# Patient Record
Sex: Female | Born: 1955
Health system: Southern US, Community
[De-identification: ages and names within clinical notes are randomized; demographics above are authoritative.]

## PROBLEM LIST (undated history)

## (undated) DIAGNOSIS — M1990 Unspecified osteoarthritis, unspecified site: Secondary | ICD-10-CM

## (undated) DIAGNOSIS — J189 Pneumonia, unspecified organism: Secondary | ICD-10-CM

## (undated) HISTORY — PX: CERVICAL DISC SURGERY: SHX588

## (undated) HISTORY — PX: KNEE SURGERY: SHX244

## (undated) HISTORY — PX: TONSILLECTOMY: SUR1361

---

## 1999-04-23 ENCOUNTER — Encounter: Payer: Self-pay | Admitting: Specialist

## 1999-04-23 ENCOUNTER — Encounter (INDEPENDENT_AMBULATORY_CARE_PROVIDER_SITE_OTHER): Payer: Self-pay | Admitting: Specialist

## 1999-04-24 ENCOUNTER — Inpatient Hospital Stay (HOSPITAL_COMMUNITY): Admission: EM | Admit: 1999-04-24 | Discharge: 1999-04-25 | Payer: Self-pay | Admitting: Specialist

## 1999-10-12 ENCOUNTER — Emergency Department (HOSPITAL_COMMUNITY): Admission: EM | Admit: 1999-10-12 | Discharge: 1999-10-12 | Payer: Self-pay | Admitting: *Deleted

## 2000-04-03 ENCOUNTER — Emergency Department (HOSPITAL_COMMUNITY): Admission: EM | Admit: 2000-04-03 | Discharge: 2000-04-03 | Payer: Self-pay | Admitting: Emergency Medicine

## 2002-04-26 ENCOUNTER — Other Ambulatory Visit: Admission: RE | Admit: 2002-04-26 | Discharge: 2002-04-26 | Payer: Self-pay | Admitting: Obstetrics and Gynecology

## 2003-04-28 ENCOUNTER — Inpatient Hospital Stay (HOSPITAL_COMMUNITY): Admission: EM | Admit: 2003-04-28 | Discharge: 2003-05-02 | Payer: Self-pay | Admitting: Emergency Medicine

## 2003-04-29 ENCOUNTER — Encounter: Payer: Self-pay | Admitting: Surgery

## 2003-05-02 ENCOUNTER — Inpatient Hospital Stay (HOSPITAL_COMMUNITY): Admission: EM | Admit: 2003-05-02 | Discharge: 2003-05-05 | Payer: Self-pay | Admitting: Emergency Medicine

## 2003-05-02 ENCOUNTER — Encounter: Payer: Self-pay | Admitting: General Surgery

## 2003-05-03 ENCOUNTER — Encounter: Payer: Self-pay | Admitting: Surgery

## 2003-07-05 ENCOUNTER — Encounter: Payer: Self-pay | Admitting: Gastroenterology

## 2003-07-05 ENCOUNTER — Ambulatory Visit (HOSPITAL_COMMUNITY): Admission: RE | Admit: 2003-07-05 | Discharge: 2003-07-05 | Payer: Self-pay | Admitting: Gastroenterology

## 2005-06-07 ENCOUNTER — Ambulatory Visit (HOSPITAL_COMMUNITY): Admission: RE | Admit: 2005-06-07 | Discharge: 2005-06-07 | Payer: Self-pay | Admitting: Orthopedic Surgery

## 2005-06-14 ENCOUNTER — Emergency Department (HOSPITAL_COMMUNITY): Admission: EM | Admit: 2005-06-14 | Discharge: 2005-06-14 | Payer: Self-pay | Admitting: Emergency Medicine

## 2005-07-22 ENCOUNTER — Ambulatory Visit (HOSPITAL_COMMUNITY): Admission: RE | Admit: 2005-07-22 | Discharge: 2005-07-22 | Payer: Self-pay | Admitting: Orthopedic Surgery

## 2005-08-25 ENCOUNTER — Ambulatory Visit (HOSPITAL_BASED_OUTPATIENT_CLINIC_OR_DEPARTMENT_OTHER): Admission: RE | Admit: 2005-08-25 | Discharge: 2005-08-25 | Payer: Self-pay | Admitting: Orthopedic Surgery

## 2005-08-25 ENCOUNTER — Ambulatory Visit (HOSPITAL_COMMUNITY): Admission: RE | Admit: 2005-08-25 | Discharge: 2005-08-25 | Payer: Self-pay | Admitting: Orthopedic Surgery

## 2005-09-02 ENCOUNTER — Emergency Department (HOSPITAL_COMMUNITY): Admission: EM | Admit: 2005-09-02 | Discharge: 2005-09-02 | Payer: Self-pay | Admitting: Emergency Medicine

## 2005-09-14 ENCOUNTER — Encounter: Admission: RE | Admit: 2005-09-14 | Discharge: 2005-10-27 | Payer: Self-pay | Admitting: Orthopedic Surgery

## 2006-03-10 ENCOUNTER — Other Ambulatory Visit: Admission: RE | Admit: 2006-03-10 | Discharge: 2006-03-10 | Payer: Self-pay | Admitting: Family Medicine

## 2007-02-25 ENCOUNTER — Emergency Department (HOSPITAL_COMMUNITY): Admission: EM | Admit: 2007-02-25 | Discharge: 2007-02-25 | Payer: Self-pay | Admitting: Emergency Medicine

## 2007-04-06 ENCOUNTER — Encounter: Admission: RE | Admit: 2007-04-06 | Discharge: 2007-04-06 | Payer: Self-pay | Admitting: Occupational Medicine

## 2007-05-04 ENCOUNTER — Ambulatory Visit (HOSPITAL_COMMUNITY): Admission: RE | Admit: 2007-05-04 | Discharge: 2007-05-04 | Payer: Self-pay | Admitting: Sports Medicine

## 2007-08-22 ENCOUNTER — Ambulatory Visit (HOSPITAL_BASED_OUTPATIENT_CLINIC_OR_DEPARTMENT_OTHER): Admission: RE | Admit: 2007-08-22 | Discharge: 2007-08-23 | Payer: Self-pay | Admitting: Specialist

## 2007-09-05 ENCOUNTER — Encounter: Admission: RE | Admit: 2007-09-05 | Discharge: 2007-10-11 | Payer: Self-pay | Admitting: Specialist

## 2007-10-12 ENCOUNTER — Encounter: Admission: RE | Admit: 2007-10-12 | Discharge: 2008-01-02 | Payer: Self-pay | Admitting: Specialist

## 2007-10-16 ENCOUNTER — Encounter: Admission: RE | Admit: 2007-10-16 | Discharge: 2008-01-14 | Payer: Self-pay | Admitting: Specialist

## 2008-01-08 ENCOUNTER — Encounter: Admission: RE | Admit: 2008-01-08 | Discharge: 2008-02-08 | Payer: Self-pay | Admitting: Specialist

## 2010-11-01 ENCOUNTER — Encounter: Payer: Self-pay | Admitting: Orthopedic Surgery

## 2011-02-23 NOTE — Op Note (Signed)
Kristin Mckinney, Kristin Mckinney                 ACCOUNT NO.:  [id number]   MEDICAL RECORD NO.:  [id number]          PATIENT TYPE:  AMB   LOCATION:  [facility]                         FACILITY:  [facility]   PHYSICIAN:  Erasmo Leventhal, M.D.DATE OF BIRTH:  May 26, 1956   DATE OF PROCEDURE:  08/22/2007  DATE OF DISCHARGE:  08/23/2007                               OPERATIVE REPORT   PREOPERATIVE DIAGNOSES:  Right knee possible torn meniscus, possible  torn anterior cruciate ligament.   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   PROCEDURE:  Right knee arthroscopic assisted allograft anterior cruciate  ligament reconstruction, chondroplasty patella, chondroplasty medial  femoral condyle.   SURGEON:  Erasmo Leventhal, M.D.   ASSISTANT:  Jaquelyn Bitter. Chabon, P.A.   ANESTHESIA:  Preoperative femoral nerve block with general.   ESTIMATED BLOOD LOSS:  Less than 50 mL.   DRAINS:  None.   COMPLICATIONS:  None.   TOURNIQUET TIME:  One hour 30 minutes at 300 mmHg.   DESCRIPTION OF PROCEDURE:  The patient was counseled in the holding  area.  IV had been started.  Femoral nerve block was administered.  Taken to the operating room, placed in prone position under general  anesthesia.  IV antibiotics were given.  All extremities were well  padded.  Right knee was examined.  She had full extension.  She could  flex to 125.  She had a 1+ Lachman modern end point, 1+ anterior drawer,  moderate end point and a positive flexion rotation drawer.  She was  stable to varus and valgus stress at 0 and 30 degrees of flexion.  PCL  and posterolateral corner were stable.  She was elevated, prepped with  DuraPrep and draped into a sterile fashion, exsanguinated with an  Esmarch, tourniquet was inflated to 300 mmHg.  Arthroscopic portals were  established proximal medial, anteromedial and anterolateral.  Diagnostic  arthroscopy  revealed the following findings.  Patellofemoral joint  revealed normal tracking.  There was grade 3 chondromalacia lateral  patella and facet and the chondroplasty was performed with mechanical  shaver.  Intercondylar notch revealed the ACL to be torn and  nonfunctional.  It  was debrided.  Notchplasty was performed in standard  fashion.  PCL was protected.  Lateral side was inspected.  Mild  chondromalacia lateral side, lateral meniscus intact.  Medial side  inspected.  She had undergone a partial medial meniscectomy in the past,  it was stable.  There was no recurrent tear, but there was a large area  of grade 3 chondromalacia medial femoral condyle with an unstable flap,  mechanical chondroplasty performed back to a stable base.  An age and  size appropriate anterior tibialis allograft had been chosen and was  prepared on the back table by Mr. Leilani Able, PA-C.   Following this, the lateral incision made through the skin and  subcutaneous tissue to the IT band.  A guide pin was placed directly  into the ACL footprint on the lateral intercondylar wall.  Retro rim was  attached and retro tunnel was performed.  Tunnel was debrided on the  tibial side.  The retro reamer was placed directly to the anatomic ACL  footprint, retro tunnel was performed and tunnel was debrided.  Both  tunnels then dilated at the appropriate level.  The graft was now  delivered to the knee.  I securely fixed the femoral graft with an  Arthrex bioabsorbable screw placed in parallel fashion, put the knee  through range of motion.  There was no notch impingement.  The graft had  excellent physiometric movement pattern.  We allowed the graft to creep  for the next several minutes.  At this time, pulling distally on the  tibial side on the tibial graft, we securely fixed in the tibial tunnel  with an Arthrex bioabsorbable screw placed in parallel fashion.  We now  checked the knee.  The graft was well fixed in both  femoral and tibial  sides.  It had excellent physiometric movement pattern and knee was  clinically stable and the graft had excellent tension.  Knee was then  copiously irrigated and arthroscopic clip was removed.  On the lateral  side, IT band was closed with Vicryl, subcu Vicryl, skin with a  subcuticular Monocryl suture, portals nylon anteriorly, subcu Vicryl,  skin and subcuticular Monocryl suture.  After confirming anesthesia,  another  15 mL of 5 cm Marcaine with epinephrine placed in portal sites  and into the knee joint.  Sterile dressings applied at the knee.  Tourniquet was deflated.  Another gram of Ancef given intravenously.  Sterile dressings applied.  She had normal circulation of the foot and  ankle at the end of the case.  There was no complications or problems.  She was then taken from the operating room to PACU in stable condition.   Help with surgical technique, decision making graft preparation, Mr.  Leilani Able, PA-C.  Assistance was needed throughout the entire case.           ______________________________  Erasmo Leventhal, M.D.     RAC/MEDQ  D:  08/22/2007  T:  08/23/2007  Job:  [id number]

## 2011-02-26 NOTE — H&P (Signed)
Kristin Mckinney, Kristin Mckinney                           ACCOUNT NO.:  [id number]   MEDICAL RECORD NO.:  [id number]                   PATIENT TYPE:  INP   LOCATION:  0471                                 FACILITY:  [facility]   PHYSICIAN:  Velora Heckler, M.D.                DATE OF BIRTH:  [birth year]   DATE OF ADMISSION:  04/28/2003  DATE OF DISCHARGE:                                HISTORY & PHYSICAL   REASON FOR ADMISSION:  Abdominal pain, anorexia.   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  The patient had been in her  normal state of good health until the evening of July 17.  She developed  left lower quadrant pain.  She went home and was kept awake most of the  night by discomfort.  Discomfort persisted.  She had no significant  appetite.  She denies nausea or vomiting.  She had a normal bowel movement.  She is urinating normally.  She has had no prior such episodes.  The patient  does note mild temperature elevation.  She notes fatigue.  The patient  presented to work and then contacted me.  At my request she came to the  emergency department for laboratory studies and a specimen.   PAST MEDICAL HISTORY:  1. Status post cesarean section.  2. Status post rotator cuff repair.  3. Status post repair of deviated septum.  4. Status post knee arthroscopy.  5. Status post cervical laminectomy by Kerrin Champagne, M.D.   MEDICATIONS:  Multivitamins.   ALLERGIES:  SULFA causing rash.  Intolerance to MORPHINE, DEMEROL, and  DILAUDID all causing nausea and vomiting.   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  She does not smoke.  She does not drink alcohol.  She has two  children.  She lives in [place].   REVIEW OF SYSTEMS:  15 system review without significant other problems  except as noted  above.   FAMILY HISTORY:  Noncontributory.   PHYSICAL EXAMINATION:  GENERAL:  A 55 year old well-developed, well-  nourished white female.  Mild to moderate discomfort on a stretcher in the  emergency department.  VITAL SIGNS:  Temperature 99.5, pulse 115, respirations 20, blood pressure  122/75.  HEENT:  Normocephalic.  Sclerae are clear.  Conjunctivae are clear.  Pupils  are equal, round, and reactive.  Dentition is good.  Voice quality is  normal.  NECK:  Symmetric.  Thyroid is normal without nodularity.  There is no  anterior/posterior cervical adenopathy.  LUNGS:  Clear to auscultation.  There is no costovertebral angle tenderness.  CARDIAC:  Regular rate and rhythm without murmur.  Peripheral pulses are  full.  ABDOMEN:  Soft with mild distention.  There are bowel  sounds present.  There  is marked tenderness in the left lower quadrant and suprapubic region.  There is voluntary guarding.  There is rebound tenderness and referred  rebound tenderness to the left lower quadrant.  EXTREMITIES:  Nontender without edema.  NEUROLOGIC:  The patient is alert and oriented without focal neurologic  deficit.   LABORATORIES:  Complete blood count shows a white count 12.5.  Differential  shows left shift with segmented neutrophils.  Hemoglobin and platelets are  normal.  Electrolytes are normal.  Creatinine is normal.   Radiographic studies:  CT scan abdomen and pelvis performed by Norva Pavlov, M.D. shows findings consistent with moderate to severe acute  diverticulitis.  There is a small amount of free fluid.  There is no sign of  abscess.  There is no sign of perforation.   IMPRESSION:  Acute diverticulitis.   PLAN:  1. Admission to [facility].  2. n.p.o.  3. Initiation of intravenous antibiotics.  4. Pain control.                                               Velora Heckler, M.D.    TMG/MEDQ  D:  04/29/2003  T:  04/29/2003  Job:  [id number]

## 2011-02-26 NOTE — Discharge Summary (Signed)
[name], [name]                           ACCOUNT NO.:  [id number]   MEDICAL RECORD NO.:  [id number]                   PATIENT TYPE:  INP   LOCATION:  0474                                 FACILITY:  [facility]   PHYSICIAN:  Sandria Bales. Ezzard Standing, M.D.               DATE OF BIRTH:  09/07/1956   DATE OF ADMISSION:  05/02/2003  DATE OF DISCHARGE:  05/05/2003                                 DISCHARGE SUMMARY   DISCHARGE DIAGNOSES:  1. Sigmoid colon diverticulitis with possible small perforation.  2. Oral Candidiasis.   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  She was  discharged on May 02, 2003.  She was discharged home on Cipro and Flagyl.  Her white blood cell count had never been elevated during this episode, but  she developed nausea and vomiting.  She was readmitted by Dr. Thornton Dales  on May 02, 2003.  Her admission temperature was 98.9, pulse was 56, blood  pressure was 135/78.   On her abdominal exam, she had some lower abdominal tenderness, and she had  a repeat CT scan.   HOSPITAL COURSE:  Laboratory data obtained on readmission showed a white  blood cell count of 5800, hemoglobin 11.8, hematocrit 33.  Her sodium was  143, potassium 3.4, chloride 115, CO2 25, glucose 112, total protein 5.5,  albumin 2.8.   The patient was placed on Unasyn on this admission.   Her TCT scan does show an area of inflammatory changes at the proximal  sigmoid colon.  There appears to be a possible focal perforation with a 1-cm  fluid collection, which appears to be outside the lumen of the bowel.  She  also has some fluid around the liver we think is nonspecific and probably  secondary to her __________ on her sigmoid colon.  Because of the way she  was feeling, she certainly had some anxiety in the hospital, she was given  some Ambien to help her sleep, finally started feeling better.  I  switched  her from IV Unasyn to oral Augmentin, which she was tolerating fairly well,  though she developed oral Candidiasis.  She has now been in the hospital for  three days.  She is feeling better.  She is balancing her diet between clear  liquids and full liquids.  She is ready for discharge.   DISCHARGE MEDICATIONS:  1. Augmentin 500 mg one tablet b.i.d. x2 weeks.  2. Because of oral Candidiasis, I gave her Nystatin suspension 5 cc t.i.d.  3. She is given Vicodin for pain.   DIET:  She is to limit her diet.    ACTIVITY:  She is to limit her activity.   FOLLOWUP:  She is to see me back in 7 to 10 days for followup.  Sandria Bales. Ezzard Standing, M.D.    DHN/MEDQ  D:  05/05/2003  T:  05/05/2003  Job:  [id number]   cc:   Velora Heckler, M.D.  [address] [place]  [place] [address]  Fax: [phone]

## 2011-05-22 ENCOUNTER — Inpatient Hospital Stay (INDEPENDENT_AMBULATORY_CARE_PROVIDER_SITE_OTHER)
Admission: RE | Admit: 2011-05-22 | Discharge: 2011-05-22 | Disposition: A | Discharge status: Home / Self Care | Payer: 59 | Source: Ambulatory Visit | Attending: Family Medicine | Admitting: Family Medicine

## 2011-05-22 DIAGNOSIS — N390 Urinary tract infection, site not specified: Secondary | ICD-10-CM

## 2011-05-22 LAB — POCT URINALYSIS DIP (DEVICE)
Ketones, ur: NEGATIVE mg/dL
Protein, ur: >=300 mg/dL — AB
Urobilinogen, UA: 0.2 mg/dL (ref 0.0–1.0)
pH: 5 (ref 5.0–8.0)

## 2013-05-16 ENCOUNTER — Other Ambulatory Visit (HOSPITAL_COMMUNITY): Payer: Self-pay | Admitting: Orthopedic Surgery

## 2013-05-16 DIAGNOSIS — M25562 Pain in left knee: Secondary | ICD-10-CM

## 2013-05-17 ENCOUNTER — Ambulatory Visit (HOSPITAL_COMMUNITY): Admission: RE | Admit: 2013-05-17 | Payer: 59 | Source: Ambulatory Visit

## 2013-05-21 ENCOUNTER — Ambulatory Visit (HOSPITAL_COMMUNITY): Admission: RE | Admit: 2013-05-21 | Payer: 59 | Source: Ambulatory Visit

## 2015-06-27 ENCOUNTER — Emergency Department (HOSPITAL_COMMUNITY)
Admission: EM | Admit: 2015-06-27 | Discharge: 2015-06-27 | Disposition: A | Discharge status: Home / Self Care | Payer: PRIVATE HEALTH INSURANCE | Attending: Emergency Medicine | Admitting: Emergency Medicine

## 2015-06-27 ENCOUNTER — Encounter (HOSPITAL_COMMUNITY): Payer: Self-pay | Admitting: Emergency Medicine

## 2015-06-27 DIAGNOSIS — Y9289 Other specified places as the place of occurrence of the external cause: Secondary | ICD-10-CM | POA: Insufficient documentation

## 2015-06-27 DIAGNOSIS — X58XXXA Exposure to other specified factors, initial encounter: Secondary | ICD-10-CM | POA: Diagnosis not present

## 2015-06-27 DIAGNOSIS — S29012A Strain of muscle and tendon of back wall of thorax, initial encounter: Secondary | ICD-10-CM | POA: Diagnosis not present

## 2015-06-27 DIAGNOSIS — Y9389 Activity, other specified: Secondary | ICD-10-CM | POA: Insufficient documentation

## 2015-06-27 DIAGNOSIS — S299XXA Unspecified injury of thorax, initial encounter: Secondary | ICD-10-CM | POA: Diagnosis present

## 2015-06-27 DIAGNOSIS — Y998 Other external cause status: Secondary | ICD-10-CM | POA: Insufficient documentation

## 2015-06-27 DIAGNOSIS — Z79899 Other long term (current) drug therapy: Secondary | ICD-10-CM | POA: Insufficient documentation

## 2015-06-27 MED ORDER — METHOCARBAMOL 500 MG PO TABS
750.0000 mg | ORAL_TABLET | Freq: Once | ORAL | Status: AC
  Administered 2015-06-27: 750 mg via ORAL
  Filled 2015-06-27: qty 2

## 2015-06-27 MED ORDER — KETOROLAC TROMETHAMINE 30 MG/ML IJ SOLN
30.0000 mg | Freq: Once | INTRAMUSCULAR | Status: AC
  Administered 2015-06-27: 30 mg via INTRAMUSCULAR
  Filled 2015-06-27: qty 1

## 2015-06-27 MED ORDER — NAPROXEN 375 MG PO TABS: 375.0000 mg | ORAL_TABLET | Freq: Three times a day (TID) | ORAL | Status: DC

## 2015-06-27 MED ORDER — METHOCARBAMOL 750 MG PO TABS: 750.0000 mg | ORAL_TABLET | Freq: Three times a day (TID) | ORAL | Status: DC

## 2015-06-27 NOTE — Discharge Instructions (Signed)
The medication as directed.  Follow-up with occupational health, perhaps for physical therapy referral given a work note that he may return on September 20 for a work restriction until the 22nd

## 2015-06-27 NOTE — ED Notes (Signed)
Pt reports 2330 yesterday evening assisting patient in bed and felt strain in left lower back. Pain increasing with mobility. Pain from left side of neck to left mid back, difficulty turning head to left, difficulty raising left arm above shoulder. Denies numbness/tingling. Rates pain 8/10.

## 2015-06-27 NOTE — ED Provider Notes (Signed)
CSN: [id number]     Arrival date & time 06/27/15  0250 History   First MD Initiated Contact with Patient 06/27/15 0324     Chief Complaint  Patient presents with  . 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  She was assisting a patient in bed holding her forward with her left arm when she suddenly felt a strain in her shoulder blade area.  It has become worse over the last 1-2 hours, now it's painful to turn her head to the right lift her arm all the way or move in certain positions.  Patient is a 59 y.o. female presenting with back pain. The history is provided by the patient.  Back Pain Location:  Thoracic spine Quality:  Aching Radiates to:  Does not radiate Pain severity:  Moderate Pain is:  Same all the time Onset quality:  Sudden Timing:  Constant Progression:  Unchanged Chronicity:  New Context: lifting heavy objects   Relieved by:  Nothing Worsened by:  Movement Ineffective treatments:  None tried Associated symptoms: no chest pain, no fever, no numbness and no paresthesias     History reviewed. No pertinent past medical history. Past Surgical History  Procedure Laterality Date  . Knee surgery     No family history on file. Social History  Substance Use Topics  . Smoking status: Never Smoker   . Smokeless tobacco: None  . Alcohol Use: No   OB History    No data available     Review of Systems  Constitutional: Negative for fever.  Respiratory: Negative for cough.   Cardiovascular: Negative for chest pain.  Musculoskeletal: Positive for back pain and arthralgias. Negative for neck pain and neck stiffness.  Skin: Negative for color change and rash.  Neurological: Negative for dizziness, numbness and paresthesias.  All other systems reviewed and are negative.     Allergies  Morphine and related; Percocet; and Sulfa  antibiotics  Home Medications   Prior to Admission medications   Medication Sig Start Date End Date Taking? Authorizing Provider  cetirizine (ZYRTEC) 10 MG tablet Take 10 mg by mouth daily.   Yes Historical Provider, MD  ibuprofen (ADVIL,MOTRIN) 200 MG tablet Take 400-600 mg by mouth every 6 (six) hours as needed for moderate pain.   Yes Historical Provider, MD  methocarbamol (ROBAXIN) 750 MG tablet Take 1 tablet (750 mg total) by mouth 3 (three) times daily. 06/27/15   Earley Favor, NP  Multiple Vitamin (MULTIVITAMIN WITH MINERALS) TABS tablet Take 1 tablet by mouth daily.   Yes Historical Provider, MD  naproxen (NAPROSYN) 375 MG tablet Take 1 tablet (375 mg total) by mouth 3 (three) times daily with meals. 06/27/15   Earley Favor, NP   BP 125/82 mmHg  Pulse 81  Temp(Src) 97.8 F (36.6 C) (Oral)  Resp 18  Ht  (1.676 m)  Wt 165 lb (74.844 kg)  BMI 26.64 kg/m2  SpO2 99% Physical Exam  Constitutional: She is oriented to person, place, and time. She appears well-nourished.  HENT:  Head: Normocephalic.  Eyes: Pupils are equal, round, and reactive to light.  Neck: Normal range of motion. No spinous process tenderness and no muscular tenderness present.    Cardiovascular: Normal rate.   Pulmonary/Chest: Effort normal.  Musculoskeletal: Normal range of motion.       Back:  Neurological: She is alert and oriented to person, place, and time.  Skin:  Skin is warm. No rash noted. No erythema.  Nursing note and vitals reviewed.   ED Course  Procedures (including critical care time) Labs Review Labs Reviewed - No data to display  Imaging Review No results found. I have personally reviewed and evaluated these images and lab results as part of my medical decision-making.   EKG Interpretation None     Physical examination, consistent with a thoracic muscle strain.  She will be started on Robaxin 750 mg 3 times a day as well as Naprosyn 325 mg twice a day follow-up with her primary  care physician, as well as occupational health.  She was taken out of work for several days.  She may return to work on September 20 with restrictions including no heavy lifting, pushing or pulling for 2 more days MDM   Final diagnoses:  Strain of thoracic paraspinal muscles excluding T1 and T2 levels, initial encounter         Earley Favor, NP 06/27/15 2956  Tomasita Crumble, MD 06/27/15 2130

## 2015-10-20 ENCOUNTER — Encounter: Payer: Self-pay | Admitting: Pulmonary Disease

## 2015-10-20 ENCOUNTER — Ambulatory Visit (INDEPENDENT_AMBULATORY_CARE_PROVIDER_SITE_OTHER): Payer: 59 | Admitting: Internal Medicine

## 2015-10-20 ENCOUNTER — Other Ambulatory Visit: Payer: Self-pay | Admitting: Internal Medicine

## 2015-10-20 ENCOUNTER — Other Ambulatory Visit (INDEPENDENT_AMBULATORY_CARE_PROVIDER_SITE_OTHER): Payer: 59

## 2015-10-20 ENCOUNTER — Ambulatory Visit (INDEPENDENT_AMBULATORY_CARE_PROVIDER_SITE_OTHER): Payer: 59 | Admitting: Pulmonary Disease

## 2015-10-20 VITALS — BP 118/80 | HR 75 | Ht 66.0 in | Wt 183.4 lb

## 2015-10-20 VITALS — BP 118/80 | HR 75 | Temp 97.0°F | Ht 66.0 in | Wt 183.4 lb

## 2015-10-20 DIAGNOSIS — R059 Cough, unspecified: Secondary | ICD-10-CM

## 2015-10-20 DIAGNOSIS — R05 Cough: Secondary | ICD-10-CM

## 2015-10-20 DIAGNOSIS — J181 Lobar pneumonia, unspecified organism: Secondary | ICD-10-CM

## 2015-10-20 LAB — CBC WITH DIFFERENTIAL/PLATELET
BASOS PCT: 0.4 % (ref 0.0–3.0)
Basophils Absolute: 0 10*3/uL (ref 0.0–0.1)
EOS PCT: 4.4 % (ref 0.0–5.0)
Eosinophils Absolute: 0.4 10*3/uL (ref 0.0–0.7)
HCT: 40.9 % (ref 36.0–46.0)
Hemoglobin: 13.8 g/dL (ref 12.0–15.0)
LYMPHS ABS: 2.1 10*3/uL (ref 0.7–4.0)
Lymphocytes Relative: 22.3 % (ref 12.0–46.0)
MCHC: 33.6 g/dL (ref 30.0–36.0)
MCV: 87.2 fl (ref 78.0–100.0)
MONO ABS: 0.6 10*3/uL (ref 0.1–1.0)
Monocytes Relative: 5.8 % (ref 3.0–12.0)
NEUTROS PCT: 67.1 % (ref 43.0–77.0)
Neutro Abs: 6.4 10*3/uL (ref 1.4–7.7)
Platelets: 293 10*3/uL (ref 150.0–400.0)
RBC: 4.69 Mil/uL (ref 3.87–5.11)
RDW: 14.8 % (ref 11.5–15.5)
WBC: 9.6 10*3/uL (ref 4.0–10.5)

## 2015-10-20 LAB — SEDIMENTATION RATE: SED RATE: 28 mm/h — AB (ref 0–22)

## 2015-10-20 MED ORDER — CEFDINIR 300 MG PO CAPS: 300.0000 mg | ORAL_CAPSULE | Freq: Two times a day (BID) | ORAL | Status: DC

## 2015-10-20 MED FILL — CEFDINIR 300 MG CAPSULE: 300 | 10 days supply | Qty: 20 | Fill #0

## 2015-10-20 NOTE — Patient Instructions (Addendum)
Please remember to go to the lab department downstairs for your tests - we will call you with the results when they are available.  omnicef 300 mg twice daily x 10 days   Please see patient coordinator before you leave today  to schedule sinus CT   Try prilosec otc 20mg   Take 30-60 min before first meal of the day and Pepcid ac (famotidine) 20 mg one @  bedtime until cough is completely gone for at least a week without the need for cough suppression  GERD (REFLUX)  is an extremely common cause of respiratory symptoms just like yours , many times with no obvious heartburn at all.    It can be treated with medication, but also with lifestyle changes including elevation of the head of your bed (ideally with 6 inch  bed blocks),  Smoking cessation, avoidance of late meals, excessive alcohol, and avoid fatty foods, chocolate, peppermint, colas, red wine, and acidic juices such as orange juice.  NO MINT OR MENTHOL PRODUCTS SO NO COUGH DROPS  USE SUGARLESS CANDY INSTEAD (Jolley ranchers or Stover's or Life Savers) or even ice chips will also do - the key is to swallow to prevent all throat clearing. NO OIL BASED VITAMINS - use powdered substitutes.      Please schedule a follow up office visit in 2 weeks, sooner if needed with cxr on return

## 2015-10-20 NOTE — Progress Notes (Signed)
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   10/20/2015 1st [facility] office visit/ Kristin Mckinney   Cough onset x one month, most productive p shower but also has at hs > brown and thick though cleared tranisently p levaquin assoc with nasal congestion and sense of pnds with harsh coughing fits     No obvious day to day or daytime variability or assoc  cp or chest tightness, subjective wheeze or overt   hb symptoms. No unusual exp hx or h/o childhood pna/ asthma or knowledge of premature birth.  Sleeping ok without nocturnal  or early am exacerbation  of respiratory  c/o's or need for noct saba. Also denies any obvious fluctuation of symptoms with weather or environmental changes or other aggravating or alleviating factors except as outlined above   Current Medications, Allergies, Complete Past Medical History, Past Surgical History, Family History, and Social History were reviewed in [employer] record.  ROS  The following are not active complaints unless bolded sore throat, dysphagia, dental problems, itching, sneezing,  nasal congestion or excess/ purulent secretions, ear ache,   fever, chills, sweats, unintended wt loss, classically pleuritic or exertional cp, hemoptysis,  orthopnea pnd or leg swelling, presyncope, palpitations, abdominal pain, anorexia, nausea, vomiting, diarrhea  or change in bowel or bladder habits, change in stools or urine, dysuria,hematuria,  rash, arthralgias, visual complaints, headache, numbness,  weakness or ataxia or problems with walking or coordination,  change in mood/affect or memory.           Review of Systems     Objective:   Physical Exam    amb wf nad  Wt Readings from Last 3 Encounters:  10/20/15 183 lb 6.4 oz (83.19 kg)  06/27/15 165 lb (74.844 kg)    Vital signs reviewed   HEENT: nl dentition, turbinates, and oropharynx. Nl external ear canals without cough reflex   NECK :  without JVD/Nodes/TM/ nl carotid upstrokes bilaterally   LUNGS: no acc muscle use,  Nl contour chest  With a few insp > exp rhonchi R > L chest  without cough on insp or exp maneuvers   CV:  RRR  no s3 or murmur or increase in P2, no edema   ABD:  soft and nontender with nl inspiratory excursion in the supine position. No bruits or organomegaly, bowel sounds nl  MS:  Nl gait/ ext warm without deformities, calf tenderness, cyanosis or clubbing No obvious joint restrictions   SKIN: warm and dry without lesions    NEURO:  alert, approp, nl sensorium with  no motor deficits    Labs ordered 10/20/2015  Lab Results  Component Value Date   WBC 9.6 10/20/2015   HGB 13.8 10/20/2015   HCT 40.9 10/20/2015   MCV 87.2 10/20/2015   PLT 293.0 10/20/2015  Eos   0.4     Lab Results  Component Value Date   ESRSEDRATE 28* 10/20/2015     cxr's not avail for  review at time of ov  Assessment:

## 2015-10-21 ENCOUNTER — Encounter: Payer: Self-pay | Admitting: Internal Medicine

## 2015-10-21 DIAGNOSIS — J181 Lobar pneumonia, unspecified organism: Secondary | ICD-10-CM | POA: Insufficient documentation

## 2015-10-21 NOTE — Assessment & Plan Note (Signed)
Explained the natural history of uri and why it's necessary in patients at risk to treat GERD aggressively - at least  short term -   to reduce risk of evolving cyclical cough initially  triggered by epithelial injury and a heightened sensitivty to the effects of any upper airway irritants,  most importantly acid - related - then perpetuated by epithelial injury related to the cough itself as the upper airway collapses on itself.  That is, the more sensitive the epithelium becomes once it is damaged by the virus, the more the ensuing irritability> the more the cough, the more the secondary reflux (especially in those prone to reflux) the more the irritation of the sensitive mucosa and so on in a  Classic cyclical pattern.   

## 2015-10-21 NOTE — Assessment & Plan Note (Signed)
Eos  10/20/2015  =  0.4  ESR  10/20/2015 =  28    Clinically much better p rx with levaquin with main complaints upper airway so rec rx this with omnicef and gerd rx then return for f/u cxr for what I suspect is just a cxr lag at this point and eval in meantime for underlying sinus dz with ct sinus   ddx includes eos pna/ boop as well so will need close f/u > 2 weeks with cxr  I had an extended discussion with the patient reviewing all relevant studies completed to date and  lasting 35  minutes of a 60 minute visit    Each maintenance medication was reviewed in detail including most importantly the difference between maintenance and prns and under what circumstances the prns are to be triggered using an action plan format that is not reflected in the computer generated alphabetically organized AVS.    Please see instructions for details which were reviewed in writing and the patient given a copy highlighting the part that I personally wrote and discussed at today's ov.

## 2015-10-22 ENCOUNTER — Telehealth: Payer: Self-pay | Admitting: Internal Medicine

## 2015-10-22 LAB — FOOD ALLERGY PROFILE
Allergen, Salmon, f41: <0.1 kU/L
Egg White IgE: <0.1 kU/L
Hazelnut: <0.1 kU/L
Peanut IgE: <0.1 kU/L
Scallop IgE: <0.1 kU/L
Sesame Seed f10: <0.1 kU/L
Shrimp IgE: <0.1 kU/L
Soybean IgE: <0.1 kU/L
Tuna IgE: <0.1 kU/L
Walnut: <0.1 kU/L
Wheat IgE: <0.1 kU/L

## 2015-10-22 NOTE — Telephone Encounter (Signed)
Will route to MW to make him aware.  

## 2015-10-23 ENCOUNTER — Telehealth: Payer: Self-pay | Admitting: Internal Medicine

## 2015-10-23 ENCOUNTER — Encounter: Payer: Self-pay | Admitting: *Deleted

## 2015-10-23 NOTE — Telephone Encounter (Signed)
Called spoke with pt. She reports she saw MW on 10/20/15. She tried to work last night and cough came back full force. She is requesting a work note for tonight if possible Per that OV: Patient Instructions       Please remember to go to the lab department downstairs for your tests - we will call you with the results when they are available.  omnicef 300 mg twice daily x 10 days  Please see patient coordinator before you leave today  to schedule sinus CT  Try prilosec otc 20mg   Take 30-60 min before first meal of the day and Pepcid ac (famotidine) 20 mg one @  bedtime until cough is completely gone for at least a week without the need for cough suppression  GERD (REFLUX)  is an extremely common cause of respiratory symptoms just like yours , many times with no obvious heartburn at all.    It can be treated with medication, but also with lifestyle changes including elevation of the head of your bed (ideally with 6 inch  bed blocks),  Smoking cessation, avoidance of late meals, excessive alcohol, and avoid fatty foods, chocolate, peppermint, colas, red wine, and acidic juices such as orange juice.   NO MINT OR MENTHOL PRODUCTS SO NO COUGH DROPS  USE SUGARLESS CANDY INSTEAD (Jolley ranchers or Stover's or Life Savers) or even ice chips will also do - the key is to swallow to prevent all throat clearing. NO OIL BASED VITAMINS - use powdered substitutes. Please schedule a follow up office visit in 2 weeks, sooner if needed with cxr on return     Please advise MW thanks

## 2015-10-23 NOTE — Telephone Encounter (Signed)
lmtcb X1 for pt  

## 2015-10-23 NOTE — Telephone Encounter (Signed)
Spoke with pt and advised of Dr wert's recommendations.  Work note completed and left at front desk and also faxed to [employer] at [phone] per pt request.

## 2015-10-23 NOTE — Telephone Encounter (Signed)
Yes would not plan on return to work until Monday the 16th of Jan 2017 otherwise just likely to aggravate the cough

## 2015-10-26 ENCOUNTER — Encounter: Payer: Self-pay | Admitting: Internal Medicine

## 2015-10-27 ENCOUNTER — Inpatient Hospital Stay: Admission: RE | Admit: 2015-10-27 | Payer: Self-pay | Source: Ambulatory Visit

## 2015-10-30 ENCOUNTER — Ambulatory Visit (INDEPENDENT_AMBULATORY_CARE_PROVIDER_SITE_OTHER): Payer: 59 | Admitting: Internal Medicine

## 2015-10-30 ENCOUNTER — Encounter: Payer: Self-pay | Admitting: Internal Medicine

## 2015-10-30 ENCOUNTER — Encounter: Payer: Self-pay | Admitting: *Deleted

## 2015-10-30 ENCOUNTER — Ambulatory Visit (INDEPENDENT_AMBULATORY_CARE_PROVIDER_SITE_OTHER)
Admission: RE | Admit: 2015-10-30 | Discharge: 2015-10-30 | Disposition: A | Discharge status: Home / Self Care | Payer: 59 | Source: Ambulatory Visit | Attending: Internal Medicine | Admitting: Internal Medicine

## 2015-10-30 VITALS — BP 134/82 | HR 87 | Temp 98.0°F | Ht 66.0 in | Wt 180.0 lb

## 2015-10-30 DIAGNOSIS — R05 Cough: Secondary | ICD-10-CM

## 2015-10-30 DIAGNOSIS — J181 Lobar pneumonia, unspecified organism: Secondary | ICD-10-CM | POA: Diagnosis not present

## 2015-10-30 DIAGNOSIS — R059 Cough, unspecified: Secondary | ICD-10-CM

## 2015-10-30 MED ORDER — CEFDINIR 300 MG PO CAPS: 300.0000 mg | ORAL_CAPSULE | Freq: Two times a day (BID) | ORAL | Status: DC

## 2015-10-30 MED ORDER — FLUTTER DEVI: Status: AC

## 2015-10-30 MED ORDER — METHYLPREDNISOLONE ACETATE 80 MG/ML IJ SUSP
120.0000 mg | Freq: Once | INTRAMUSCULAR | Status: AC
  Administered 2015-10-30: 120 mg via INTRAMUSCULAR

## 2015-10-30 MED FILL — CEFDINIR 300 MG CAPSULE: 300 | 10 days supply | Qty: 20 | Fill #0

## 2015-10-30 NOTE — Progress Notes (Signed)
Subjective:     Patient ID: Kristin Mckinney, female   DOB: 11-10-1955     MRN: [id number]    Brief patient profile:  59 yowf night nurse at [employer] never smoker with lifelong sinus problems with tendency to intermittent watery rhinitis/ nasal congestion esp in winter and new onset aching all over fever/ increase pnds mid dec 2016 > Dr  Clarene Duke  Eval > dx RLL pna rx 10 days levaquin/ course of pred and neb and mucus cleared then gradually worse x around 10/15/14 > referred to pulmonary clinic 10/20/2015 by  Dr Clarene Duke with persistent cough.   History of Present Illness  10/20/2015 1st [facility] office visit/ Ilana Prezioso   Cough onset x one month, most productive p shower but also has at hs > brown and thick though cleared transiently p levaquin assoc with nasal congestion and sense of pnds with harsh coughing fits    rec Please remember to go to the lab department downstairs for your tests - we will call you with the results when they are available. omnicef 300 mg twice daily x 10 days  Please see patient coordinator before you leave today  to schedule sinus CT  Try prilosec otc   Take 30-60 min before first meal of the day and Pepcid ac (famotidine) 20 mg one @  bedtime until cough is completely gone for at least a week without the need for cough suppression GERD diet     10/30/2015  Acute  ov/[place] re: recurrent cough last dose omnicef 10/28/14 and stopped gerd rx  Chief Complaint  Patient presents with  . Acute Visit    Pt c/o increased cough since this am, sinus congestion, runny nose. Cough is prod with light yellow sputum.  She has also had some f/c/s.    worked for the first time night of 1/18 with onset of watery nasal drainage then went to bed took zyrtec and woke up p 6 h > raw throat coughing fits  No more fever /chills/ aches    No obvious day to day or daytime variability or assoc sob or cp or chest tightness, subjective wheeze or overt   hb symptoms. No unusual exp hx or h/o childhood  pna/ asthma or knowledge of premature birth.  Sleeping ok without nocturnal  or early am exacerbation  of respiratory  c/o's or need for noct saba. Also denies any obvious fluctuation of symptoms with weather or environmental changes or other aggravating or alleviating factors except as outlined above   Current Medications, Allergies, Complete Past Medical History, Past Surgical History, Family History, and Social History were reviewed in [employer] record.  ROS  The following are not active complaints unless bolded sore throat, dysphagia, dental problems, itching, sneezing,  nasal congestion or excess/ purulent secretions, ear ache,   fever, chills, sweats, unintended wt loss, classically pleuritic or exertional cp, hemoptysis,  orthopnea pnd or leg swelling, presyncope, palpitations, abdominal pain, anorexia, nausea, vomiting, diarrhea  or change in bowel or bladder habits, change in stools or urine, dysuria,hematuria,  rash, arthralgias, visual complaints, headache, numbness, weakness or ataxia or problems with walking or coordination,  change in mood/affect or memory.               Objective:   Physical Exam    amb wf nad    Wt Readings from Last 3 Encounters:  10/30/15 180 lb (81.647 kg)  10/21/15 183 lb 6.4 oz (83.19 kg)  10/20/15 183 lb  6.4 oz (83.19 kg)    Vital signs reviewed    HEENT: nl dentition, turbinates, and oropharynx. Nl external ear canals without cough reflex   NECK :  without JVD/Nodes/TM/ nl carotid upstrokes bilaterally   LUNGS: no acc muscle use,  Nl contour chest  Min insp > exp rhonchi ? Mostly upper airway  without cough on insp or exp maneuvers   CV:  RRR  no s3 or murmur or increase in P2, no edema   ABD:  soft and nontender with nl inspiratory excursion in the supine position. No bruits or organomegaly, bowel sounds nl  MS:  Nl gait/ ext warm without deformities, calf tenderness, cyanosis or clubbing No obvious joint  restrictions   SKIN: warm and dry without lesions    NEURO:  alert, approp, nl sensorium with  no motor deficits    Labs ordered 10/20/2015  Lab Results  Component Value Date   WBC 9.6 10/20/2015   HGB 13.8 10/20/2015   HCT 40.9 10/20/2015   MCV 87.2 10/20/2015   PLT 293.0 10/20/2015  Eos   0.4     Lab Results  Component Value Date   ESRSEDRATE 28* 10/20/2015        CXR PA and Lateral:   10/30/2015 :    I personally reviewed images and agree with radiology impression as follows:     Mediastinum and hilar structures normal. Mild cardiomegaly, no pulmonary venous congestion. No focal alveolar infiltrate. Mild interstitial prominence, most likely chronic. No pleural effusion or pneumothorax. Postsurgical changes right shoulder. My review :  NO ILD  Assessment:

## 2015-10-30 NOTE — Patient Instructions (Signed)
Please remember to go to the  x-ray department downstairs for your tests - we will call you with the results when they are available.  omnicef 300 mg twice daily if not better after the depomedrol 120 mg IM   Plan on ct tomorrow and if neg For drainage / throat tickle try take CHLORPHENIRAMINE  4 mg - take one every 4 hours as needed - available over the counter- may cause drowsiness so start with just a bedtime dose or two and see how you tolerate it before trying in daytime

## 2015-10-31 ENCOUNTER — Telehealth: Payer: Self-pay | Admitting: Internal Medicine

## 2015-10-31 NOTE — Telephone Encounter (Signed)
Result Notes     Notes Recorded by Nyoka Cowden, MD on 10/31/2015 at 11:37 AM Call pt: Reviewed cxr and no acute change- no pna   Pt is aware of results.

## 2015-11-02 ENCOUNTER — Encounter: Payer: Self-pay | Admitting: Internal Medicine

## 2015-11-02 NOTE — Assessment & Plan Note (Addendum)
Eos  10/20/2015  =  0.4  ESR  10/20/2015 =  28    No evidence at all of ongoing cap/ concerned though about sinus dz > proceed with sinus ct asap   I had an extended discussion with the patient reviewing all relevant studies completed to date and  lasting 15 to 20 minutes of a 25 minute visit    Each maintenance medication was reviewed in detail including most importantly the difference between maintenance and prns and under what circumstances the prns are to be triggered using an action plan format that is not reflected in the computer generated alphabetically organized AVS.    Please see instructions for details which were reviewed in writing and the patient given a copy highlighting the part that I personally wrote and discussed at today's ov.

## 2015-11-02 NOTE — Assessment & Plan Note (Addendum)
Most c/w  Classic Upper airway cough syndrome, so named because it's frequently impossible to sort out how much is  CR/sinusitis with freq throat clearing (which can be related to primary GERD)   vs  causing  secondary (" extra esophageal")  GERD from wide swings in gastric pressure that occur with throat clearing, often  promoting self use of mint and menthol lozenges that reduce the lower esophageal sphincter tone and exacerbate the problem further in a cyclical fashion.   These are the same pts (now being labeled as having "irritable larynx syndrome" by some cough centers) who not infrequently have a history of having failed to tolerate ace inhibitors,  dry powder inhalers or biphosphonates or report having atypical reflux symptoms that don't respond to standard doses of PPI , and are easily confused as having aecopd or asthma flares by even experienced allergists/ pulmonologists.   rec sinus ct asap then add 1st gen H1

## 2015-11-04 ENCOUNTER — Encounter: Payer: Self-pay | Admitting: *Deleted

## 2015-11-04 ENCOUNTER — Telehealth: Payer: Self-pay | Admitting: Internal Medicine

## 2015-11-04 ENCOUNTER — Ambulatory Visit (INDEPENDENT_AMBULATORY_CARE_PROVIDER_SITE_OTHER)
Admission: RE | Admit: 2015-11-04 | Discharge: 2015-11-04 | Disposition: A | Discharge status: Home / Self Care | Payer: 59 | Source: Ambulatory Visit | Attending: Internal Medicine | Admitting: Internal Medicine

## 2015-11-04 DIAGNOSIS — R0981 Nasal congestion: Secondary | ICD-10-CM | POA: Diagnosis not present

## 2015-11-04 DIAGNOSIS — R059 Cough, unspecified: Secondary | ICD-10-CM

## 2015-11-04 DIAGNOSIS — R05 Cough: Secondary | ICD-10-CM

## 2015-11-04 NOTE — Telephone Encounter (Signed)
Spoke with pt. MW wanted her to have a CT of her sinuses. This still has not been scheduled. Advised her that I would check with the [facility] and see why this hadn't been done. She will also need another work note to cover her through 11/05/15. Note will need to be faxed to [employer] [phone].  MW - please advise if you are okay with writing this note.

## 2015-11-04 NOTE — Telephone Encounter (Signed)
Per pt's chart, she was scheduled for her CT Sinus on 1.16.17 but cancelled this appt: Cancel Rsn: Patient (pt does not have wk schedule will call when she gets her schedule to resch)  The order has already been placed, per protocol pt should contact CT to reschedule.  Called spoke with pt, informed her of this and gave her the number to call.  Pt asked about another work note to excuse her from work until 1.25.17 (note will need to be faxed to [facility] as Lillia Abed mentioned below)  MW please advise, thank you.

## 2015-11-04 NOTE — Telephone Encounter (Signed)
Sorry, should have been "yes" to both questions

## 2015-11-04 NOTE — Telephone Encounter (Signed)
Yes - I've been trying to get the CT scan done since the ov, not clear where the hold up is

## 2015-11-04 NOTE — Telephone Encounter (Signed)
Work note has been faxed in. Pt is aware. Nothing further was needed.

## 2015-11-17 ENCOUNTER — Ambulatory Visit: Payer: Self-pay | Admitting: Internal Medicine

## 2015-11-18 MED FILL — traMADol HCL 50 MG TABS: 50 | 7 days supply | Qty: 60 | Fill #0

## 2015-11-18 MED FILL — CYCLOBENZAPRINE 10 MG TAB: 10 | 20 days supply | Qty: 60 | Fill #0

## 2016-03-04 DIAGNOSIS — H578 Other specified disorders of eye and adnexa: Secondary | ICD-10-CM | POA: Diagnosis not present

## 2016-03-15 MED FILL — traMADol HCL 50 MG TABS: 50 | 7 days supply | Qty: 60 | Fill #0

## 2016-03-15 MED FILL — CYCLOBENZAPRINE 10 MG TAB: 10 | 20 days supply | Qty: 60 | Fill #0

## 2016-04-08 DIAGNOSIS — M25561 Pain in right knee: Secondary | ICD-10-CM | POA: Diagnosis not present

## 2016-04-08 DIAGNOSIS — M25562 Pain in left knee: Secondary | ICD-10-CM | POA: Diagnosis not present

## 2016-05-11 MED FILL — CYCLOBENZAPRINE 10 MG TAB: 10 | 20 days supply | Qty: 60 | Fill #0

## 2016-05-11 MED FILL — traMADol HCL 50 MG TABS: 50 | 7 days supply | Qty: 60 | Fill #0

## 2016-06-18 DIAGNOSIS — H5213 Myopia, bilateral: Secondary | ICD-10-CM | POA: Diagnosis not present

## 2016-06-18 DIAGNOSIS — H1045 Other chronic allergic conjunctivitis: Secondary | ICD-10-CM | POA: Diagnosis not present

## 2016-06-18 DIAGNOSIS — H52223 Regular astigmatism, bilateral: Secondary | ICD-10-CM | POA: Diagnosis not present

## 2016-06-18 DIAGNOSIS — H524 Presbyopia: Secondary | ICD-10-CM | POA: Diagnosis not present

## 2016-06-22 MED FILL — traMADol HCL 50 MG TABS: 50 | 8 days supply | Qty: 60 | Fill #0

## 2016-07-01 DIAGNOSIS — M170 Bilateral primary osteoarthritis of knee: Secondary | ICD-10-CM | POA: Diagnosis not present

## 2016-07-06 IMAGING — DX DG CHEST 2V
2 series · 2 of 2 positions shown · non-contrast
Comparison: No recent prior.

CLINICAL DATA: Cough and congestion.

EXAM:
CHEST  2 VIEW

[chest pa]
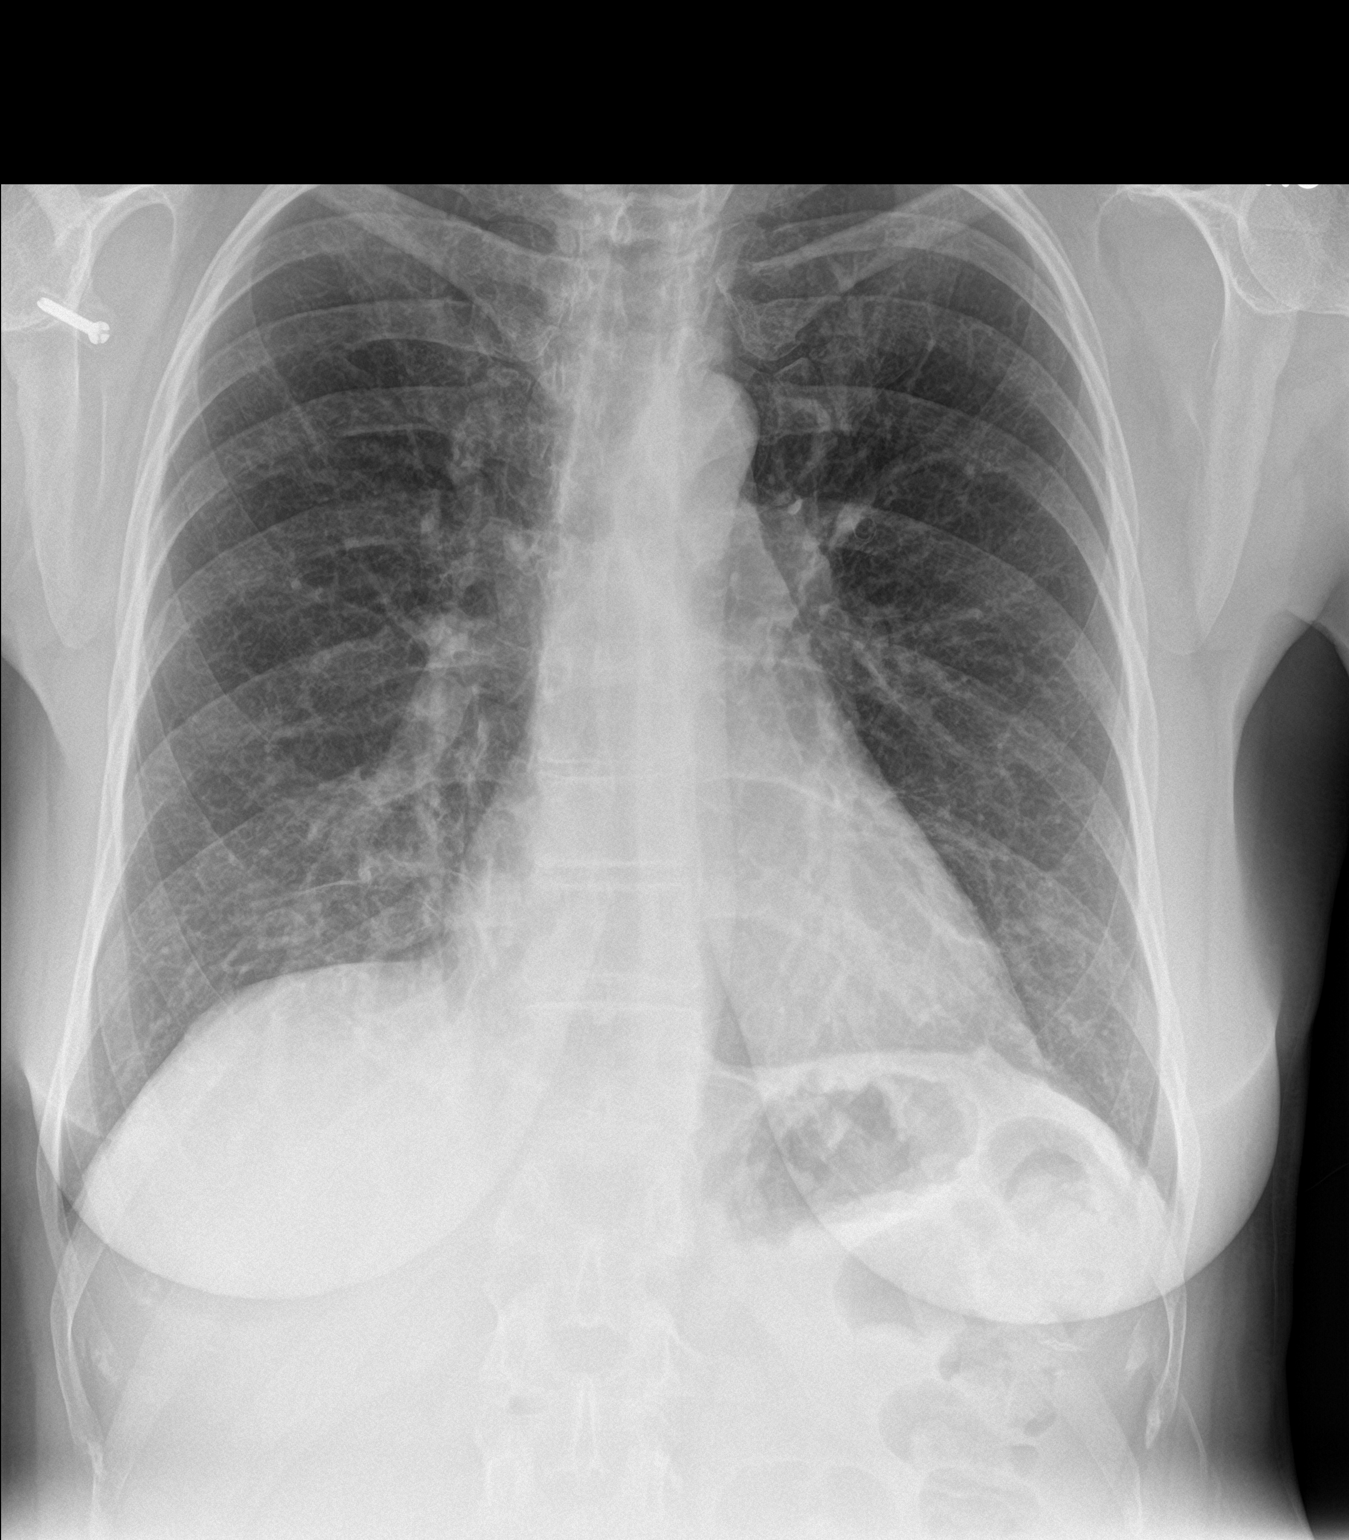

[chest lat]
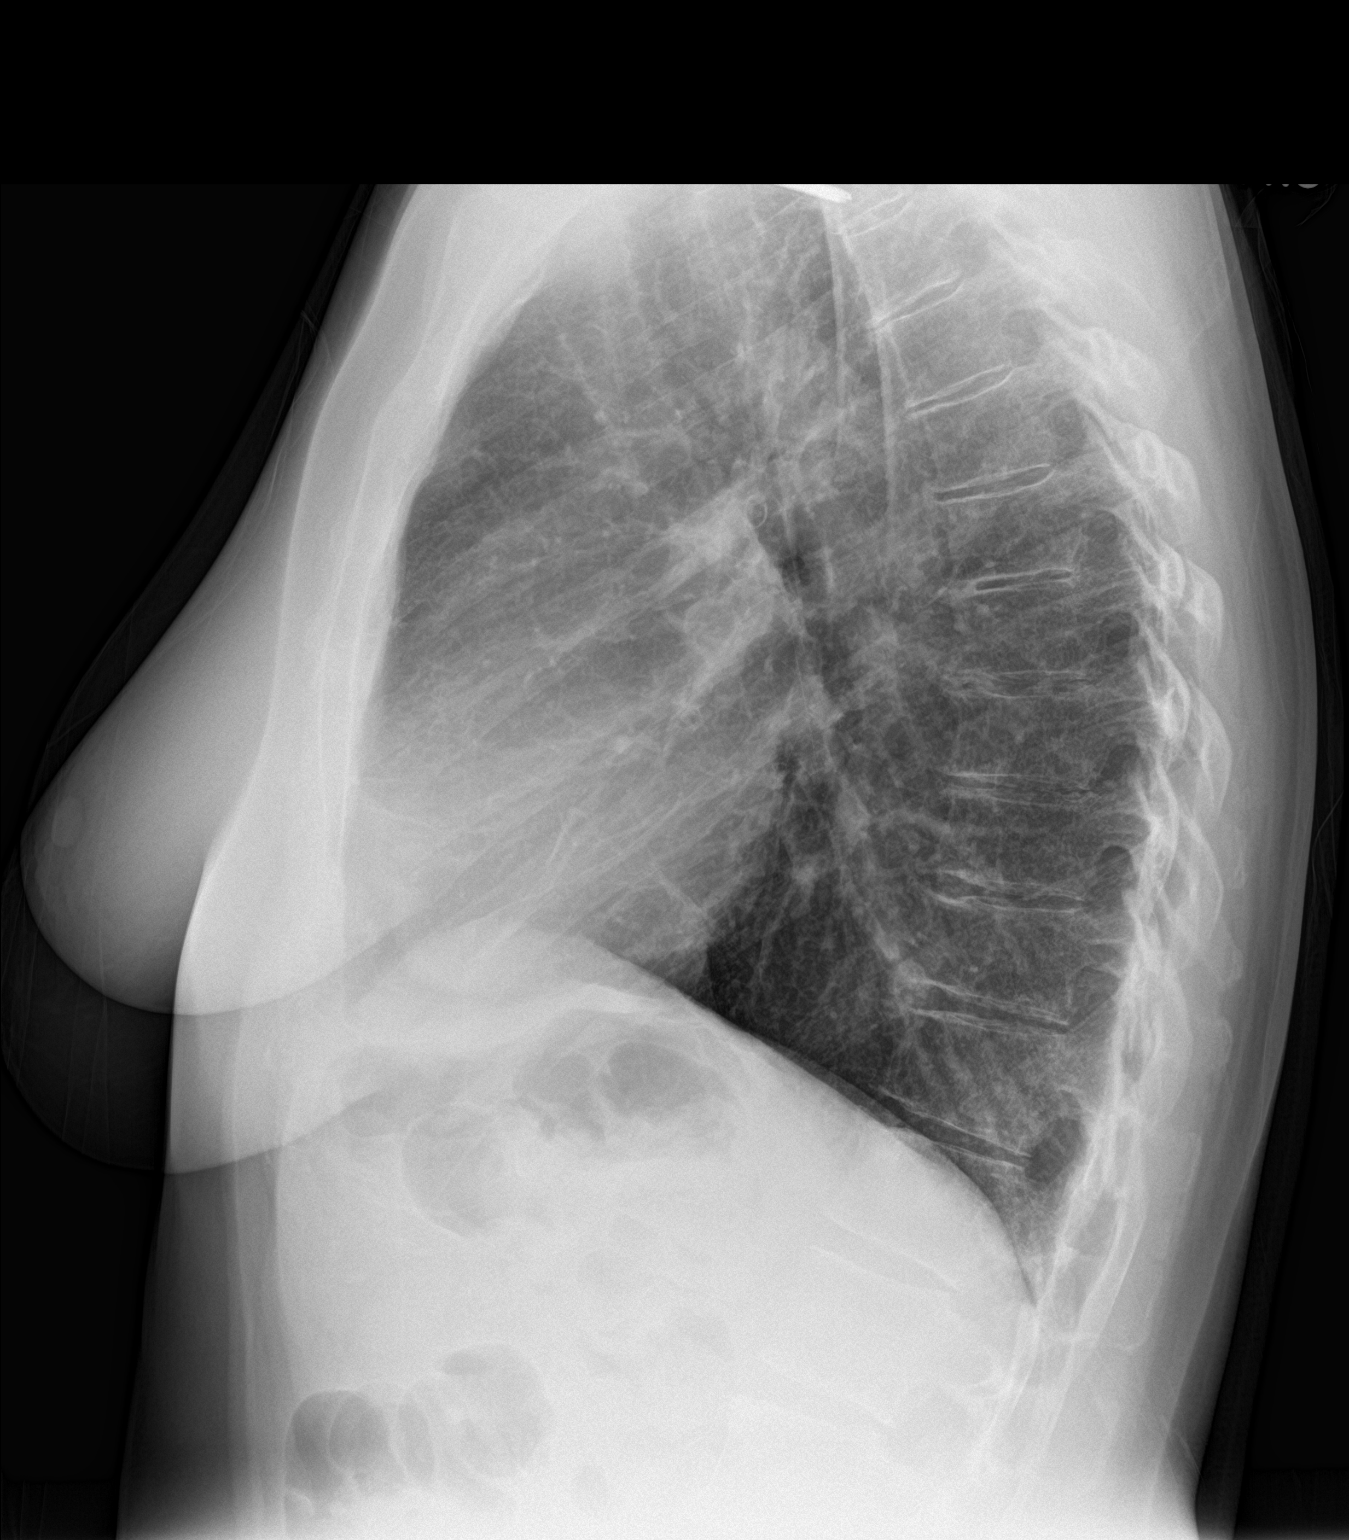

[2 of 2 positions shown; findings below may reference images not displayed]

FINDINGS: Mediastinum and hilar structures normal. Mild cardiomegaly, no
pulmonary venous congestion. No focal alveolar infiltrate. Mild
interstitial prominence, most likely chronic. No pleural effusion or
pneumothorax. Postsurgical changes right shoulder.
IMPRESSION: 1. Mild cardiomegaly.  No pulmonary venous congestion.

2. No acute pulmonary alveolar infiltrate . Mild interstitial
prominence, most likely related chronic interstitial lung disease.

## 2016-07-11 IMAGING — CT CT PARANASAL SINUSES LIMITED
1 of 2 series · 8 of 11 positions shown, 10 images · non-contrast
Comparison: None.

CLINICAL DATA: Chronic cough and sinus congestion since pneumonia
September 2015, patient on third round of antibiotics

EXAM:
CT PARANASAL SINUS LIMITED WITHOUT CONTRAST
TECHNIQUE: Non-contiguous multidetector CT images of the paranasal sinuses were
obtained in a single plane without contrast.

[Series 4: limited sinus st · axial · 0.30mm/px · z∈[+151,+221]mm · 8 of 10 slices shown, 10 images]
[im 2/10  brain]
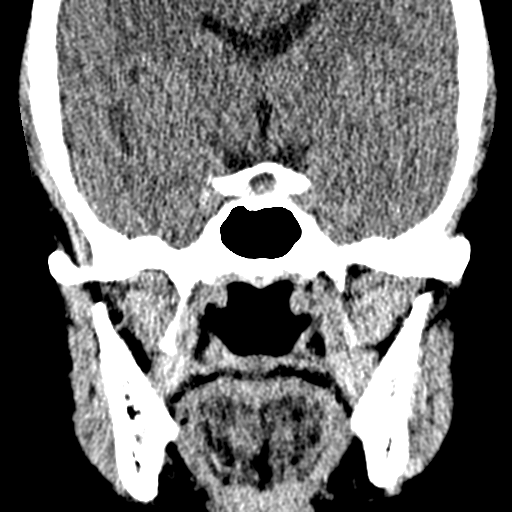
[im 2/10  bone]
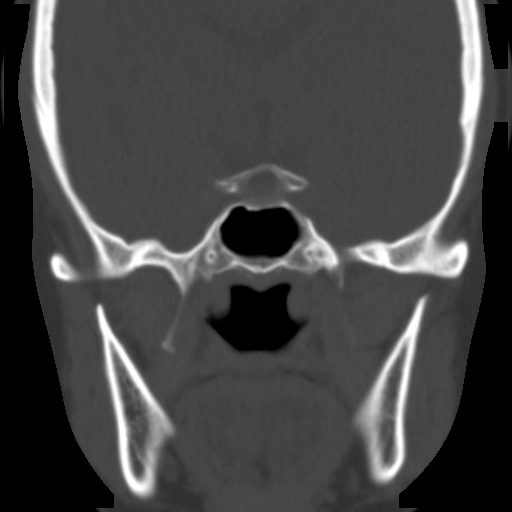
[im 3/10  bone]
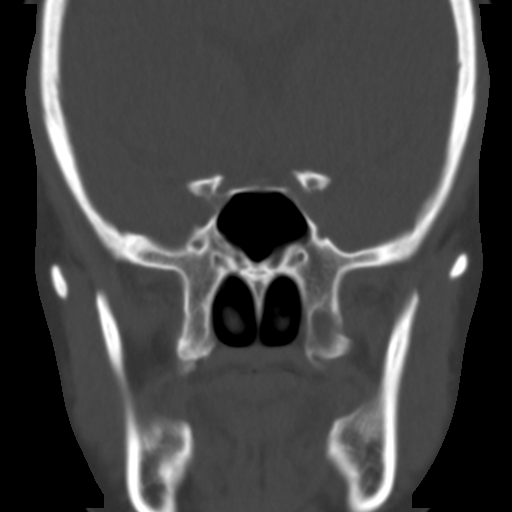
[im 4/10  bone]
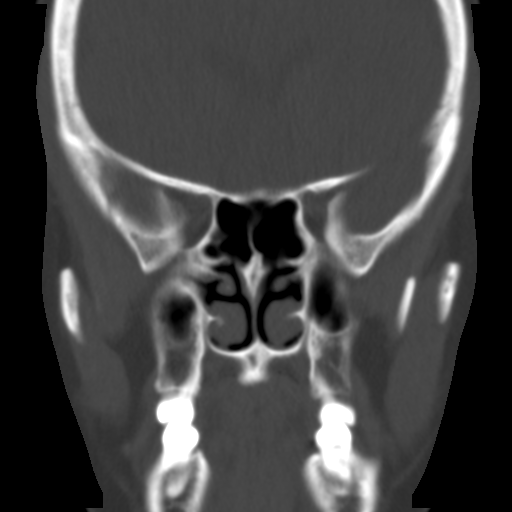
[im 5/10  bone]
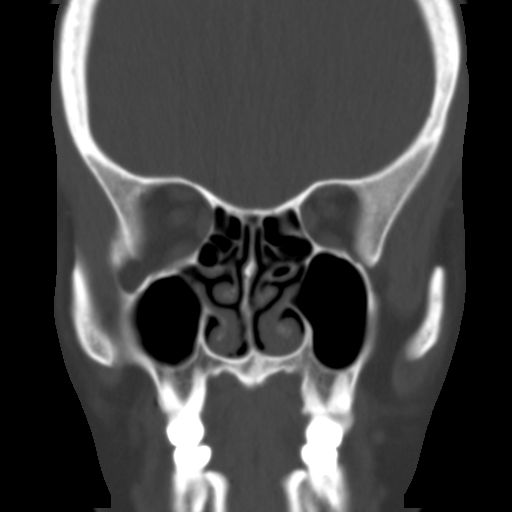
[im 6/10  brain]
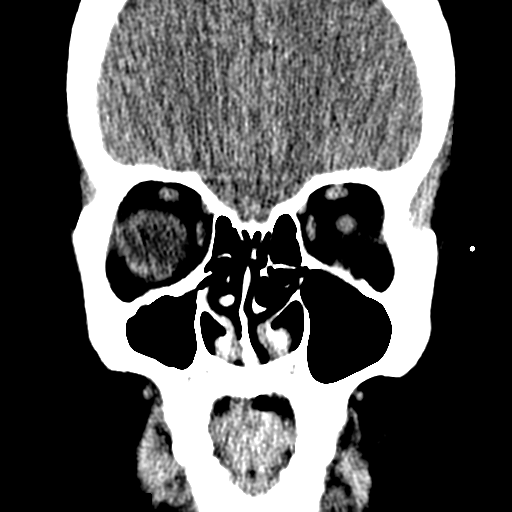
[im 6/10  bone]
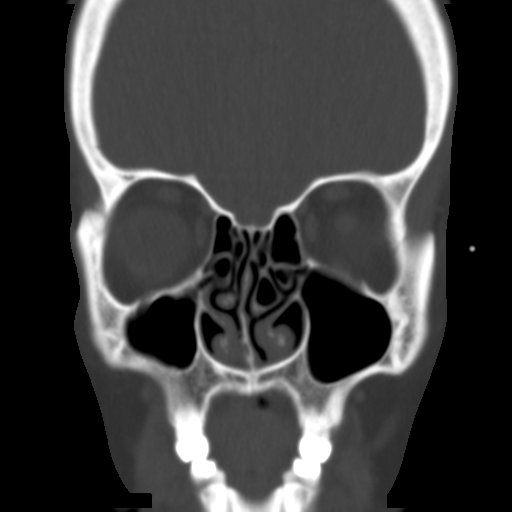
[im 7/10  bone]
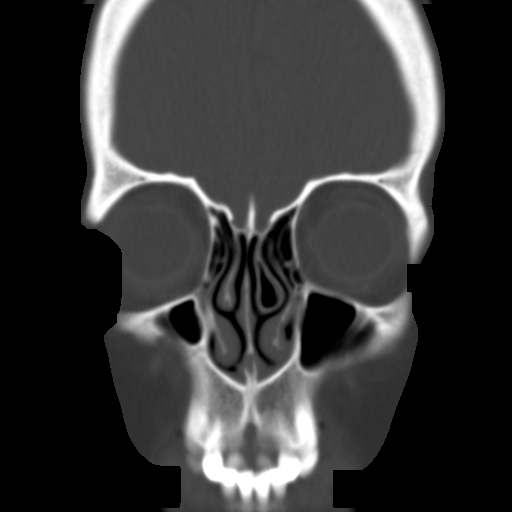
[im 8/10  bone]
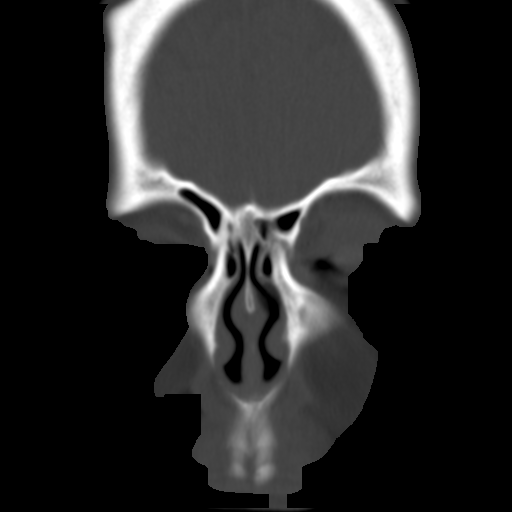
[im 9/10  bone]
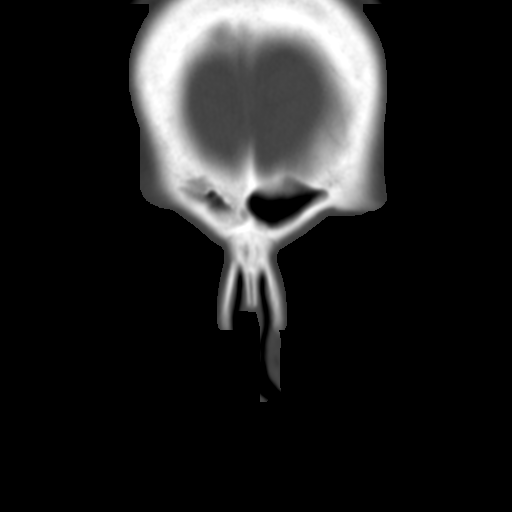

[8 of 11 positions shown; findings below may reference images not displayed]

FINDINGS: Frontal sinuses clear. Mild mucosal periosteal thickening involving
the ethmoid air cells. Sphenoid sinuses are clear. Maxillary sinuses
show no air-fluid levels. The right maxillary sinus is clear and
there is minimal mucosal periosteal thickening in the left maxillary
sinus.
IMPRESSION: No significant abnormality.  Minimal inflammatory change.

## 2016-07-13 NOTE — Progress Notes (Signed)
Pt is being scheduled for preop appt; please place surgical orders in epic. Thanks.  

## 2016-07-18 ENCOUNTER — Ambulatory Visit: Payer: Self-pay | Admitting: Orthopedic Surgery

## 2016-07-26 DIAGNOSIS — Z01812 Encounter for preprocedural laboratory examination: Secondary | ICD-10-CM | POA: Diagnosis not present

## 2016-07-26 DIAGNOSIS — M25569 Pain in unspecified knee: Secondary | ICD-10-CM | POA: Diagnosis not present

## 2016-07-26 DIAGNOSIS — Z01818 Encounter for other preprocedural examination: Secondary | ICD-10-CM | POA: Diagnosis not present

## 2016-07-29 MED FILL — CYCLOBENZAPRINE 10 MG TAB: 10 | 20 days supply | Qty: 60 | Fill #0

## 2016-07-30 MED FILL — traMADol HCL 50 MG TABS: 50 | 7 days supply | Qty: 60 | Fill #0

## 2016-08-12 NOTE — Patient Instructions (Signed)
Kristin Mckinney  08/12/2016   Your procedure is scheduled on: Monday 08/23/2016  [facility]  Entrance take [place]  elevators to 3rd floor to  Short Stay Center at  0730  AM.  Call this number if you have problems the morning of surgery [phone]   Remember: ONLY 1 PERSON MAY GO WITH YOU TO SHORT STAY TO GET  READY MORNING OF YOUR SURGERY.    Do not eat food or drink liquids :After Midnight.     Take these medicines the morning of surgery with A SIP OF WATER: none                                 You may not have any metal on your body including hair pins and              piercings  Do not wear jewelry, make-up, lotions, powders or perfumes, deodorant             Do not wear nail polish.  Do not shave  48 hours prior to surgery.              Men may shave face and neck.   Do not bring valuables to the hospital. [facility] IS NOT             RESPONSIBLE   FOR VALUABLES.  Contacts, dentures or bridgework may not be worn into surgery.  Leave suitcase in the car. After surgery it may be brought to your room.                   Please read over the following fact sheets you were given: _____________________________________________________________________             [facility] - Preparing for [facility] Before surgery, you can play an important role.  Because skin is not sterile, your skin needs to be as free of germs as possible.  You can reduce the number of germs on your skin by washing with CHG (chlorahexidine gluconate) soap before surgery.  CHG is an antiseptic cleaner which kills germs and bonds with the skin to continue killing germs even after washing. Please DO NOT use if you have an allergy to CHG or antibacterial soaps.  If your skin becomes reddened/irritated stop using the CHG and inform your nurse when you arrive at Short Stay. Do not shave (including legs and underarms) for at least 48 hours prior to the first CHG shower.  You may  shave your face/neck. Please follow these instructions carefully:  1.  Shower with CHG Soap the night before surgery and the  morning of Surgery.  2.  If you choose to wash your hair, wash your hair first as usual with your  normal  shampoo.  3.  After you shampoo, rinse your hair and body thoroughly to remove the  shampoo.                           4.  Use CHG as you would any other liquid soap.  You can apply chg directly  to the skin and wash                       Gently with a scrungie or clean washcloth.  5.  Apply the CHG Soap to your body ONLY FROM THE NECK DOWN.   Do not use on face/ open                           Wound or open sores. Avoid contact with eyes, ears mouth and genitals (private parts).                       Wash face,  Genitals (private parts) with your normal soap.             6.  Wash thoroughly, paying special attention to the area where your surgery  will be performed.  7.  Thoroughly rinse your body with warm water from the neck down.  8.  DO NOT shower/wash with your normal soap after using and rinsing off  the CHG Soap.                9.  Pat yourself dry with a clean towel.            10.  Wear clean pajamas.            11.  Place clean sheets on your bed the night of your first shower and do not  sleep with pets. Day of Surgery : Do not apply any lotions/deodorants the morning of surgery.  Please wear clean clothes to the hospital/surgery center.  FAILURE TO FOLLOW THESE INSTRUCTIONS MAY RESULT IN THE CANCELLATION OF YOUR SURGERY PATIENT SIGNATURE_________________________________  NURSE SIGNATURE__________________________________  ________________________________________________________________________   Kristin Mckinney  An incentive spirometer is a tool that can help keep your lungs clear and active. This tool measures how well you are filling your lungs with each breath. Taking long deep breaths may help reverse or decrease the chance of developing  breathing (pulmonary) problems (especially infection) following:  A long period of time when you are unable to move or be active. BEFORE THE PROCEDURE   If the spirometer includes an indicator to show your best effort, your nurse or respiratory therapist will set it to a desired goal.  If possible, sit up straight or lean slightly forward. Try not to slouch.  Hold the incentive spirometer in an upright position. INSTRUCTIONS FOR USE  1. Sit on the edge of your bed if possible, or sit up as far as you can in bed or on a chair. 2. Hold the incentive spirometer in an upright position. 3. Breathe out normally. 4. Place the mouthpiece in your mouth and seal your lips tightly around it. 5. Breathe in slowly and as deeply as possible, raising the piston or the ball toward the top of the column. 6. Hold your breath for 3-5 seconds or for as long as possible. Allow the piston or ball to fall to the bottom of the column. 7. Remove the mouthpiece from your mouth and breathe out normally. 8. Rest for a few seconds and repeat Steps 1 through 7 at least 10 times every 1-2 hours when you are awake. Take your time and take a few normal breaths between deep breaths. 9. The spirometer may include an indicator to show your best effort. Use the indicator as a goal to work toward during each repetition. 10. After each set of 10 deep breaths, practice coughing to be sure your lungs are clear. If you have an incision (the cut made at the time of surgery), support your incision when coughing by placing a  pillow or rolled up towels firmly against it. Once you are able to get out of bed, walk around indoors and cough well. You may stop using the incentive spirometer when instructed by your caregiver.  RISKS AND COMPLICATIONS  Take your time so you do not get dizzy or light-headed.  If you are in pain, you may need to take or ask for pain medication before doing incentive spirometry. It is harder to take a deep  breath if you are having pain. AFTER USE  Rest and breathe slowly and easily.  It can be helpful to keep track of a log of your progress. Your caregiver can provide you with a simple table to help with this. If you are using the spirometer at home, follow these instructions: [place] IF:   You are having difficultly using the spirometer.  You have trouble using the spirometer as often as instructed.  Your pain medication is not giving enough relief while using the spirometer.  You develop fever of 100.5 F (38.1 C) or higher. SEEK IMMEDIATE MEDICAL CARE IF:   You cough up bloody sputum that had not been present before.  You develop fever of 102 F (38.9 C) or greater.  You develop worsening pain at or near the incision site. MAKE SURE YOU:   Understand these instructions.  Will watch your condition.  Will get help right away if you are not doing well or get worse. Document Released: 02/07/2007 Document Revised: 12/20/2011 Document Reviewed: 04/10/2007 ExitCare Patient Information 2014 ExitCare, [place].   ________________________________________________________________________  WHAT IS A BLOOD TRANSFUSION? Blood Transfusion Information  A transfusion is the replacement of blood or some of its parts. Blood is made up of multiple cells which provide different functions.  Red blood cells carry oxygen and are used for blood loss replacement.  White blood cells fight against infection.  Platelets control bleeding.  Plasma helps clot blood.  Other blood products are available for specialized needs, such as hemophilia or other clotting disorders. BEFORE THE TRANSFUSION  Who gives blood for transfusions?   Healthy volunteers who are fully evaluated to make sure their blood is safe. This is blood bank blood. Transfusion therapy is the safest it has ever been in the practice of medicine. Before blood is taken from a donor, a complete history is taken to make sure  that person has no history of diseases nor engages in risky social behavior (examples are intravenous drug use or sexual activity with multiple partners). The donor's travel history is screened to minimize risk of transmitting infections, such as malaria. The donated blood is tested for signs of infectious diseases, such as HIV and hepatitis. The blood is then tested to be sure it is compatible with you in order to minimize the chance of a transfusion reaction. If you or a relative donates blood, this is often done in anticipation of surgery and is not appropriate for emergency situations. It takes many days to process the donated blood. RISKS AND COMPLICATIONS Although transfusion therapy is very safe and saves many lives, the main dangers of transfusion include:   Getting an infectious disease.  Developing a transfusion reaction. This is an allergic reaction to something in the blood you were given. Every precaution is taken to prevent this. The decision to have a blood transfusion has been considered carefully by your caregiver before blood is given. Blood is not given unless the benefits outweigh the risks. AFTER THE TRANSFUSION  Right after receiving a blood transfusion, you  will usually feel much better and more energetic. This is especially true if your red blood cells have gotten low (anemic). The transfusion raises the level of the red blood cells which carry oxygen, and this usually causes an energy increase.  The nurse administering the transfusion will monitor you carefully for complications. HOME CARE INSTRUCTIONS  No special instructions are needed after a transfusion. You may find your energy is better. Speak with your caregiver about any limitations on activity for underlying diseases you may have. SEEK MEDICAL CARE IF:   Your condition is not improving after your transfusion.  You develop redness or irritation at the intravenous (IV) site. SEEK IMMEDIATE MEDICAL CARE IF:  Any of  the following symptoms occur over the next 12 hours:  Shaking chills.  You have a temperature by mouth above 102 F (38.9 C), not controlled by medicine.  Chest, back, or muscle pain.  People around you feel you are not acting correctly or are confused.  Shortness of breath or difficulty breathing.  Dizziness and fainting.  You get a rash or develop hives.  You have a decrease in urine output.  Your urine turns a dark color or changes to pink, red, or brown. Any of the following symptoms occur over the next 10 days:  You have a temperature by mouth above 102 F (38.9 C), not controlled by medicine.  Shortness of breath.  Weakness after normal activity.  The white part of the eye turns yellow (jaundice).  You have a decrease in the amount of urine or are urinating less often.  Your urine turns a dark color or changes to pink, red, or brown. Document Released: 09/24/2000 Document Revised: 12/20/2011 Document Reviewed: 05/13/2008 [facility] Patient Information 2014 [place], [place].  _______________________________________________________________________

## 2016-08-12 NOTE — Progress Notes (Signed)
10/31/2015- noted in EPIC-CXR 2 view

## 2016-08-16 ENCOUNTER — Encounter (HOSPITAL_COMMUNITY)
Admission: RE | Admit: 2016-08-16 | Discharge: 2016-08-16 | Disposition: A | Discharge status: Home / Self Care | Payer: 59 | Source: Ambulatory Visit | Attending: Orthopedic Surgery | Admitting: Orthopedic Surgery

## 2016-08-16 ENCOUNTER — Encounter (HOSPITAL_COMMUNITY): Payer: Self-pay

## 2016-08-16 DIAGNOSIS — Z0183 Encounter for blood typing: Secondary | ICD-10-CM | POA: Insufficient documentation

## 2016-08-16 DIAGNOSIS — M1712 Unilateral primary osteoarthritis, left knee: Secondary | ICD-10-CM | POA: Diagnosis not present

## 2016-08-16 DIAGNOSIS — Z01812 Encounter for preprocedural laboratory examination: Secondary | ICD-10-CM | POA: Diagnosis not present

## 2016-08-16 HISTORY — DX: Unspecified osteoarthritis, unspecified site: M19.90

## 2016-08-16 HISTORY — DX: Pneumonia, unspecified organism: J18.9

## 2016-08-16 LAB — URINE MICROSCOPIC-ADD ON

## 2016-08-16 LAB — URINALYSIS, ROUTINE W REFLEX MICROSCOPIC
BILIRUBIN URINE: NEGATIVE
Glucose, UA: NEGATIVE mg/dL
Ketones, ur: NEGATIVE mg/dL
NITRITE: NEGATIVE
PH: 6 (ref 5.0–8.0)
Protein, ur: NEGATIVE mg/dL
SPECIFIC GRAVITY, URINE: 1.015 (ref 1.005–1.030)

## 2016-08-16 LAB — COMPREHENSIVE METABOLIC PANEL
ALBUMIN: 4.2 g/dL (ref 3.5–5.0)
ALK PHOS: 90 U/L (ref 38–126)
ALT: 10 U/L — AB (ref 14–54)
AST: 23 U/L (ref 15–41)
Anion gap: 8 (ref 5–15)
BILIRUBIN TOTAL: 0.5 mg/dL (ref 0.3–1.2)
BUN: 15 mg/dL (ref 6–20)
CALCIUM: 9.6 mg/dL (ref 8.9–10.3)
CO2: 29 mmol/L (ref 22–32)
CREATININE: 0.74 mg/dL (ref 0.44–1.00)
Chloride: 102 mmol/L (ref 101–111)
GFR calc Af Amer: >60 mL/min (ref >60)
GLUCOSE: 97 mg/dL (ref 65–99)
POTASSIUM: 4.2 mmol/L (ref 3.5–5.1)
Sodium: 139 mmol/L (ref 135–145)
TOTAL PROTEIN: 7.2 g/dL (ref 6.5–8.1)

## 2016-08-16 LAB — CBC
HEMATOCRIT: 38.5 % (ref 36.0–46.0)
HEMOGLOBIN: 12.6 g/dL (ref 12.0–15.0)
MCH: 29.2 pg (ref 26.0–34.0)
MCHC: 32.7 g/dL (ref 30.0–36.0)
MCV: 89.1 fL (ref 78.0–100.0)
Platelets: 218 10*3/uL (ref 150–400)
RBC: 4.32 MIL/uL (ref 3.87–5.11)
RDW: 14 % (ref 11.5–15.5)
WBC: 5.8 10*3/uL (ref 4.0–10.5)

## 2016-08-16 LAB — SURGICAL PCR SCREEN
MRSA, PCR: NEGATIVE
Staphylococcus aureus: NEGATIVE

## 2016-08-16 LAB — PROTIME-INR
INR: 0.92
PROTHROMBIN TIME: 12.4 s (ref 11.4–15.2)

## 2016-08-16 LAB — APTT: APTT: 28 s (ref 24–36)

## 2016-08-16 NOTE — Pre-Procedure Instructions (Addendum)
CXR 1'17 Epic. 08-16-16 1430 Note per Epic to Dr. Lequita HaltAluisio [phone] -to note urinalysis.

## 2016-08-16 NOTE — Progress Notes (Signed)
08-16-16 Pt has labs viewable in Epic-please note urinalysis report.

## 2016-08-17 LAB — ABO/RH: ABO/RH(D): O POS

## 2016-08-22 ENCOUNTER — Ambulatory Visit: Payer: Self-pay | Admitting: Orthopedic Surgery

## 2016-08-22 NOTE — H&P (Signed)
Kristin JobsJayne P Mckinney DOB: 1956-01-29 Single / Language: [place] / Race: White Female Date of Admission:  08/23/2016 CC:  Bilateral Knee Pain History of Present Illness  The patient is a 60 year old female who comes in for a preoperative History and Physical. The patient is scheduled for a left total knee arthroplasty and a right knee cortisone injection to be performed by Dr. Gus RankinFrank V. Aluisio, [facility] on 08-23-2016. The patient is a 60 year old female who presented with knee complaints. The patient reports left knee (worse than right) symptoms including: pain, swelling, stiffness, soreness and difficulty ambulating, especially going up stairs which began 1 year(s) (on the right. The left knee has been bothering her intermittently for a long time) ago.The patient feels that the symptoms are worsening. Prior to being seen, the patient was previously evaluated in this clinic. Previous work-up for this problem has included knee x-rays. Past treatment for this problem has included intra-articular injection of corticosteroids and nonsteroidal anti-inflammatory drugs. Symptoms are exacerbated by motion at the knee, weight bearing and walking. Current treatment includes nonsteroidal anti-inflammatory drugs (ibuprofen, prn. She also takes Ultram and Flexeril, occasionally, on days that she does not have to work). Risk factors include arthroscopic surgery (hx of ACL reconstruction in the right knee several years ago, by Dr. Thomasena Edisollins). It is felt that she would benefit from undergoing a total knee replacement on the left and she would also like to get a right knee cortisone injection. They have been treated conservatively in the past for the above stated problem and despite conservative measures, they continue to have progressive pain and severe functional limitations and dysfunction. They have failed non-operative management including home exercise, medications, and injections. It is felt that they  would benefit from undergoing total joint replacement. Risks and benefits of the procedure have been discussed with the patient and they elect to proceed with surgery. There are no active contraindications to surgery such as ongoing infection or rapidly progressive neurological disease.  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 Morphine Sulfate *ANALGESICS - OPIOID*  Nausea. intolerance, caused nausea and vomiting Allergies Reconciled  Latex Exam Gloves *MEDICAL DEVICES AND SUPPLIES*  Rash.  Family History  Chronic Obstructive Lung Disease  mother Heart disease in female family member before age 58  Cancer  father  Social History  Number of flights of stairs before winded  greater than 5 Pain Contract  no Tobacco / smoke exposure  no Illicit drug use  no Living situation  live alone Marital status  divorced Tobacco use  never smoker Exercise  Exercises weekly; does running / walking Copy of Drug/Alcohol Rehab (Previously)  no Current work status  working full time Drug/Alcohol Rehab (Currently)  no Alcohol use  current drinker; drinks wine and hard liquor; less than 5 per week Children  2  Medication History  Ultram (50MG  Tablet, 1-2 Oral every 6 hours PRN mild pain, Taken starting 07/29/2016) Active. ([facility] ; [phone]) Cyclobenzaprine HCl (10MG  Tablet, 1 (one) Oral three times daily as needed for spasms, Taken starting 07/29/2016) Active. Multivitamin Adult (Oral) Active. ZyrTEC (10MG  Tablet, Oral) Active.  Past Surgical History  Arthroscopy of Knee  right Neck Disc Surgery     Review of Systems General Not  Present- Chills, Fatigue, Fever, Memory Loss, Night Sweats,  Weight Gain and Weight Loss. Skin Not Present- Eczema, Hives, Itching, Lesions and Rash. HEENT Not Present- Dentures, Double Vision, Headache, Hearing Loss, Tinnitus and Visual Loss. Respiratory Not Present- Allergies, Chronic Cough, Coughing up blood, Shortness of breath at rest and Shortness of breath with exertion. Cardiovascular Not Present- Chest Pain, Difficulty Breathing Lying Down, Murmur, Palpitations, Racing/skipping heartbeats and Swelling. Gastrointestinal Not Present- Abdominal Pain, Bloody Stool, Constipation, Diarrhea, Difficulty Swallowing, Heartburn, Jaundice, Loss of appetitie, Nausea and Vomiting. Female Genitourinary Not Present- Blood in Urine, Discharge, Flank Pain, Incontinence, Painful Urination, Urgency, Urinary frequency, Urinary Retention, Urinating at Night and Weak urinary stream. Musculoskeletal Present- Joint Pain. Not Present- Back Pain, Joint Swelling, Morning Stiffness, Muscle Pain, Muscle Weakness and Spasms. Neurological Not Present- Blackout spells, Difficulty with balance, Dizziness, Paralysis, Tremor and Weakness. Psychiatric Not Present- Insomnia.  Vitals  Weight: 167 lb Height: 66in Height was reported by patient. Body Surface Area: 1.85 m Body Mass Index: 26.95 kg/m  Pulse: 72 (Regular)  BP: 122/72 (Sitting, Right Arm, Standard)  Physical Exam  General Mental Status -Alert, cooperative and good historian. General Appearance-pleasant, Not in acute distress. Orientation-Oriented X3. Build & Nutrition-Well nourished and Well developed.  Head and Neck Head-normocephalic, atraumatic . Neck Global Assessment - supple, no bruit auscultated on the right, no bruit auscultated on the left.  Eye Vision-Wears corrective lenses. Pupil - Bilateral-Regular and Round. Motion - Bilateral-EOMI.  Chest and Lung Exam Auscultation Breath sounds - clear at anterior chest wall and clear at posterior chest wall. Adventitious sounds -  No Adventitious sounds.  Cardiovascular Auscultation Rhythm - Regular rate and rhythm. Heart Sounds - S1 WNL and S2 WNL. Murmurs & Other Heart Sounds - Auscultation of the heart reveals - No Murmurs.  Abdomen Palpation/Percussion Tenderness - Abdomen is non-tender to palpation. Rigidity (guarding) - Abdomen is soft. Auscultation Auscultation of the abdomen reveals - Bowel sounds normal.  Female Genitourinary Note: Not done, not pertinent to present illness   Musculoskeletal Note: Well-developed female in no distress. Both knees show no effusion. Range is about 5 to 125 on each side. Has marked crepitus on range of motion of each knee. She has some tenderness medial greater than lateral on the left worse than right knee. There is no instability noted.   Assessment & Plan Osteoarthritis of right knee (M17.11) Osteoarthritis of left knee (M17.12) Current Plans  Note:Surgical Plans: Left Total Knee Replacement and a Right Knee Cortisone Injection  Disposition: Home  [facility]  IV TXA  Anesthesia Issues: Nausea with general anesthesia in the past  Signed electronically by Beckey RutterAlezandrew L Damere Brandenburg, III PA-C

## 2016-08-22 NOTE — H&P (Deleted)
  The note originally documented on this encounter has been moved the the encounter in which it belongs.  

## 2016-08-22 NOTE — Progress Notes (Signed)
NEW UPDATE CONSENT ADDED  A new updated surgical consent was added to include the right knee cortisone injection along with the scheduled left TKA to be performed on 08/23/2016. Drew Perkins, PA-C  

## 2016-08-23 ENCOUNTER — Encounter (HOSPITAL_COMMUNITY)
Admission: RE | Disposition: A | Discharge status: Home Health | Payer: Self-pay | Source: Ambulatory Visit | Attending: Orthopedic Surgery

## 2016-08-23 ENCOUNTER — Inpatient Hospital Stay (HOSPITAL_COMMUNITY): Payer: 59 | Admitting: Anesthesiology

## 2016-08-23 ENCOUNTER — Inpatient Hospital Stay (HOSPITAL_COMMUNITY)
Admission: RE | Admit: 2016-08-23 | Discharge: 2016-08-25 | DRG: 470 | Disposition: A | Discharge status: Home Health | Payer: 59 | Source: Ambulatory Visit | Attending: Orthopedic Surgery | Admitting: Orthopedic Surgery

## 2016-08-23 ENCOUNTER — Encounter (HOSPITAL_COMMUNITY): Payer: Self-pay

## 2016-08-23 DIAGNOSIS — G8929 Other chronic pain: Secondary | ICD-10-CM | POA: Diagnosis present

## 2016-08-23 DIAGNOSIS — M1712 Unilateral primary osteoarthritis, left knee: Secondary | ICD-10-CM | POA: Diagnosis not present

## 2016-08-23 DIAGNOSIS — M179 Osteoarthritis of knee, unspecified: Secondary | ICD-10-CM | POA: Diagnosis present

## 2016-08-23 DIAGNOSIS — J181 Lobar pneumonia, unspecified organism: Secondary | ICD-10-CM | POA: Diagnosis not present

## 2016-08-23 DIAGNOSIS — Z885 Allergy status to narcotic agent status: Secondary | ICD-10-CM

## 2016-08-23 DIAGNOSIS — Z882 Allergy status to sulfonamides status: Secondary | ICD-10-CM | POA: Diagnosis not present

## 2016-08-23 DIAGNOSIS — Z91040 Latex allergy status: Secondary | ICD-10-CM | POA: Diagnosis not present

## 2016-08-23 DIAGNOSIS — R059 Cough, unspecified: Secondary | ICD-10-CM

## 2016-08-23 DIAGNOSIS — R05 Cough: Secondary | ICD-10-CM | POA: Diagnosis not present

## 2016-08-23 DIAGNOSIS — M1710 Unilateral primary osteoarthritis, unspecified knee: Secondary | ICD-10-CM | POA: Diagnosis present

## 2016-08-23 DIAGNOSIS — M1711 Unilateral primary osteoarthritis, right knee: Secondary | ICD-10-CM | POA: Diagnosis not present

## 2016-08-23 DIAGNOSIS — M170 Bilateral primary osteoarthritis of knee: Principal | ICD-10-CM | POA: Diagnosis present

## 2016-08-23 HISTORY — PX: TOTAL KNEE ARTHROPLASTY: SHX125

## 2016-08-23 HISTORY — PX: INJECTION KNEE: SHX2446

## 2016-08-23 LAB — TYPE AND SCREEN
ABO/RH(D): O POS
ANTIBODY SCREEN: NEGATIVE

## 2016-08-23 SURGERY — ARTHROPLASTY, KNEE, TOTAL
Anesthesia: Spinal | Site: Knee | Laterality: Right

## 2016-08-23 MED ORDER — POLYETHYLENE GLYCOL 3350 17 G PO PACK: 17.0000 g | PACK | Freq: Every day | ORAL | Status: DC | PRN

## 2016-08-23 MED ORDER — MIDAZOLAM HCL 2 MG/2ML IJ SOLN
INTRAMUSCULAR | Status: AC
  Filled 2016-08-23: qty 2

## 2016-08-23 MED ORDER — PROPOFOL 10 MG/ML IV BOLUS
INTRAVENOUS | Status: AC
  Filled 2016-08-23: qty 40

## 2016-08-23 MED ORDER — SODIUM CHLORIDE 0.9 % IJ SOLN
INTRAMUSCULAR | Status: DC | PRN
  Administered 2016-08-23: 30 mL

## 2016-08-23 MED ORDER — SODIUM CHLORIDE 0.9 % IR SOLN
Status: DC | PRN
  Administered 2016-08-23: 1000 mL

## 2016-08-23 MED ORDER — RIVAROXABAN 10 MG PO TABS
10.0000 mg | ORAL_TABLET | Freq: Every day | ORAL | Status: DC
  Administered 2016-08-24 – 2016-08-25 (×2): 10 mg via ORAL
  Filled 2016-08-23 (×2): qty 1

## 2016-08-23 MED ORDER — PHENYLEPHRINE 40 MCG/ML (10ML) SYRINGE FOR IV PUSH (FOR BLOOD PRESSURE SUPPORT)
PREFILLED_SYRINGE | INTRAVENOUS | Status: AC
  Filled 2016-08-23: qty 10

## 2016-08-23 MED ORDER — ONDANSETRON HCL 4 MG/2ML IJ SOLN: 4.0000 mg | Freq: Four times a day (QID) | INTRAMUSCULAR | Status: DC | PRN

## 2016-08-23 MED ORDER — BUPIVACAINE HCL (PF) 0.25 % IJ SOLN
INTRAMUSCULAR | Status: AC
  Filled 2016-08-23: qty 30

## 2016-08-23 MED ORDER — MIDAZOLAM HCL 5 MG/5ML IJ SOLN
INTRAMUSCULAR | Status: DC | PRN
  Administered 2016-08-23: 2 mg via INTRAVENOUS

## 2016-08-23 MED ORDER — BUPIVACAINE LIPOSOME 1.3 % IJ SUSP
INTRAMUSCULAR | Status: DC | PRN
  Administered 2016-08-23: 20 mL

## 2016-08-23 MED ORDER — BISACODYL 10 MG RE SUPP: 10.0000 mg | Freq: Every day | RECTAL | Status: DC | PRN

## 2016-08-23 MED ORDER — TRANEXAMIC ACID 1000 MG/10ML IV SOLN
1000.0000 mg | INTRAVENOUS | Status: AC
  Administered 2016-08-23: 1000 mg via INTRAVENOUS
  Filled 2016-08-23: qty 10

## 2016-08-23 MED ORDER — DOCUSATE SODIUM 100 MG PO CAPS
100.0000 mg | ORAL_CAPSULE | Freq: Two times a day (BID) | ORAL | Status: DC
  Administered 2016-08-23 – 2016-08-25 (×4): 100 mg via ORAL
  Filled 2016-08-23 (×4): qty 1

## 2016-08-23 MED ORDER — TRAMADOL HCL 50 MG PO TABS
50.0000 mg | ORAL_TABLET | Freq: Four times a day (QID) | ORAL | Status: DC | PRN
  Administered 2016-08-24 – 2016-08-25 (×6): 100 mg via ORAL
  Filled 2016-08-23 (×6): qty 2

## 2016-08-23 MED ORDER — METOCLOPRAMIDE HCL 5 MG/ML IJ SOLN: 5.0000 mg | Freq: Three times a day (TID) | INTRAMUSCULAR | Status: DC | PRN

## 2016-08-23 MED ORDER — LACTATED RINGERS IV SOLN: INTRAVENOUS | Status: DC

## 2016-08-23 MED ORDER — PROPOFOL 10 MG/ML IV BOLUS
INTRAVENOUS | Status: AC
  Filled 2016-08-23: qty 20

## 2016-08-23 MED ORDER — ACETAMINOPHEN 10 MG/ML IV SOLN
1000.0000 mg | Freq: Once | INTRAVENOUS | Status: AC
  Administered 2016-08-23: 1000 mg via INTRAVENOUS

## 2016-08-23 MED ORDER — BUPIVACAINE HCL (PF) 0.75 % IJ SOLN
INTRAMUSCULAR | Status: DC | PRN
  Administered 2016-08-23: 2 mL via INTRATHECAL

## 2016-08-23 MED ORDER — BUPIVACAINE HCL 0.25 % IJ SOLN
INTRAMUSCULAR | Status: DC | PRN
  Administered 2016-08-23: 20 mL

## 2016-08-23 MED ORDER — MEPERIDINE HCL 50 MG/ML IJ SOLN: 6.2500 mg | INTRAMUSCULAR | Status: DC | PRN

## 2016-08-23 MED ORDER — PROPOFOL 500 MG/50ML IV EMUL
INTRAVENOUS | Status: DC | PRN
  Administered 2016-08-23: 75 ug/kg/min via INTRAVENOUS

## 2016-08-23 MED ORDER — PHENYLEPHRINE HCL 10 MG/ML IJ SOLN
INTRAMUSCULAR | Status: DC | PRN
  Administered 2016-08-23: 80 ug via INTRAVENOUS

## 2016-08-23 MED ORDER — PROMETHAZINE HCL 25 MG/ML IJ SOLN
INTRAMUSCULAR | Status: AC
  Filled 2016-08-23: qty 1

## 2016-08-23 MED ORDER — METHOCARBAMOL 500 MG PO TABS
500.0000 mg | ORAL_TABLET | Freq: Four times a day (QID) | ORAL | Status: DC | PRN
  Administered 2016-08-24 – 2016-08-25 (×5): 500 mg via ORAL
  Filled 2016-08-23 (×5): qty 1

## 2016-08-23 MED ORDER — METHYLPREDNISOLONE ACETATE 40 MG/ML IJ SUSP
INTRAMUSCULAR | Status: AC
  Filled 2016-08-23: qty 2

## 2016-08-23 MED ORDER — MENTHOL 3 MG MT LOZG: 1.0000 | LOZENGE | OROMUCOSAL | Status: DC | PRN

## 2016-08-23 MED ORDER — SODIUM CHLORIDE 0.9 % IJ SOLN
INTRAMUSCULAR | Status: AC
  Filled 2016-08-23: qty 50

## 2016-08-23 MED ORDER — ONDANSETRON HCL 4 MG PO TABS: 4.0000 mg | ORAL_TABLET | Freq: Four times a day (QID) | ORAL | Status: DC | PRN

## 2016-08-23 MED ORDER — FLEET ENEMA 7-19 GM/118ML RE ENEM: 1.0000 | ENEMA | Freq: Once | RECTAL | Status: DC | PRN

## 2016-08-23 MED ORDER — TRANEXAMIC ACID 1000 MG/10ML IV SOLN
1000.0000 mg | Freq: Once | INTRAVENOUS | Status: AC
  Administered 2016-08-23: 1000 mg via INTRAVENOUS
  Filled 2016-08-23: qty 10

## 2016-08-23 MED ORDER — BUPIVACAINE LIPOSOME 1.3 % IJ SUSP
20.0000 mL | Freq: Once | INTRAMUSCULAR | Status: DC
  Filled 2016-08-23: qty 20

## 2016-08-23 MED ORDER — LACTATED RINGERS IV SOLN
INTRAVENOUS | Status: DC
  Administered 2016-08-23 (×2): via INTRAVENOUS

## 2016-08-23 MED ORDER — FENTANYL CITRATE (PF) 100 MCG/2ML IJ SOLN
INTRAMUSCULAR | Status: AC
  Filled 2016-08-23: qty 2

## 2016-08-23 MED ORDER — CEFAZOLIN SODIUM-DEXTROSE 2-4 GM/100ML-% IV SOLN
INTRAVENOUS | Status: AC
  Filled 2016-08-23: qty 100

## 2016-08-23 MED ORDER — SODIUM CHLORIDE 0.9 % IV SOLN
INTRAVENOUS | Status: DC
  Administered 2016-08-23: 17:00:00 via INTRAVENOUS

## 2016-08-23 MED ORDER — HYDROMORPHONE HCL 2 MG PO TABS
2.0000 mg | ORAL_TABLET | ORAL | Status: DC | PRN
  Administered 2016-08-23 (×2): 2 mg via ORAL
  Administered 2016-08-23 – 2016-08-25 (×10): 4 mg via ORAL
  Filled 2016-08-23 (×4): qty 2
  Filled 2016-08-23: qty 1
  Filled 2016-08-23 (×6): qty 2
  Filled 2016-08-23: qty 1

## 2016-08-23 MED ORDER — METHYLPREDNISOLONE ACETATE 80 MG/ML IJ SUSP
INTRAMUSCULAR | Status: DC | PRN
  Administered 2016-08-23: 80 mg

## 2016-08-23 MED ORDER — CEFAZOLIN SODIUM-DEXTROSE 2-4 GM/100ML-% IV SOLN
2.0000 g | Freq: Four times a day (QID) | INTRAVENOUS | Status: AC
  Administered 2016-08-23 (×2): 2 g via INTRAVENOUS
  Filled 2016-08-23 (×2): qty 100

## 2016-08-23 MED ORDER — METHOCARBAMOL 1000 MG/10ML IJ SOLN
500.0000 mg | Freq: Four times a day (QID) | INTRAVENOUS | Status: DC | PRN
  Administered 2016-08-23 (×2): 500 mg via INTRAVENOUS
  Filled 2016-08-23: qty 5
  Filled 2016-08-23 (×2): qty 550
  Filled 2016-08-23: qty 5

## 2016-08-23 MED ORDER — CEFAZOLIN SODIUM-DEXTROSE 2-4 GM/100ML-% IV SOLN
2.0000 g | INTRAVENOUS | Status: AC
  Administered 2016-08-23: 2 g via INTRAVENOUS
  Filled 2016-08-23: qty 100

## 2016-08-23 MED ORDER — STERILE WATER FOR IRRIGATION IR SOLN
Status: DC | PRN
  Administered 2016-08-23: 2000 mL

## 2016-08-23 MED ORDER — 0.9 % SODIUM CHLORIDE (POUR BTL) OPTIME
TOPICAL | Status: DC | PRN
  Administered 2016-08-23: 1000 mL

## 2016-08-23 MED ORDER — HYDROMORPHONE HCL 1 MG/ML IJ SOLN
0.5000 mg | INTRAMUSCULAR | Status: DC | PRN
  Administered 2016-08-23: 1 mg via INTRAVENOUS
  Administered 2016-08-23: 0.5 mg via INTRAVENOUS
  Administered 2016-08-24: 1 mg via INTRAVENOUS
  Administered 2016-08-25: 0.5 mg via INTRAVENOUS
  Filled 2016-08-23 (×4): qty 1

## 2016-08-23 MED ORDER — DEXAMETHASONE SODIUM PHOSPHATE 10 MG/ML IJ SOLN
10.0000 mg | Freq: Once | INTRAMUSCULAR | Status: AC
  Administered 2016-08-23: 10 mg via INTRAVENOUS

## 2016-08-23 MED ORDER — PHENOL 1.4 % MT LIQD: 1.0000 | OROMUCOSAL | Status: DC | PRN

## 2016-08-23 MED ORDER — DIPHENHYDRAMINE HCL 12.5 MG/5ML PO ELIX: 12.5000 mg | ORAL_SOLUTION | ORAL | Status: DC | PRN

## 2016-08-23 MED ORDER — CHLORHEXIDINE GLUCONATE 4 % EX LIQD: 60.0000 mL | Freq: Once | CUTANEOUS | Status: DC

## 2016-08-23 MED ORDER — METOCLOPRAMIDE HCL 5 MG PO TABS: 5.0000 mg | ORAL_TABLET | Freq: Three times a day (TID) | ORAL | Status: DC | PRN

## 2016-08-23 MED ORDER — HYDROMORPHONE HCL 1 MG/ML IJ SOLN: 0.2500 mg | INTRAMUSCULAR | Status: DC | PRN

## 2016-08-23 MED ORDER — ACETAMINOPHEN 10 MG/ML IV SOLN
INTRAVENOUS | Status: AC
  Filled 2016-08-23: qty 100

## 2016-08-23 MED ORDER — ACETAMINOPHEN 325 MG PO TABS: 650.0000 mg | ORAL_TABLET | Freq: Four times a day (QID) | ORAL | Status: DC | PRN

## 2016-08-23 MED ORDER — HYDROMORPHONE HCL 1 MG/ML IJ SOLN
0.5000 mg | INTRAMUSCULAR | Status: DC | PRN
  Administered 2016-08-23: 0.5 mg via INTRAVENOUS
  Filled 2016-08-23: qty 1

## 2016-08-23 MED ORDER — ACETAMINOPHEN 500 MG PO TABS
1000.0000 mg | ORAL_TABLET | Freq: Four times a day (QID) | ORAL | Status: AC
  Administered 2016-08-23 – 2016-08-24 (×4): 1000 mg via ORAL
  Filled 2016-08-23 (×4): qty 2

## 2016-08-23 MED ORDER — DEXAMETHASONE SODIUM PHOSPHATE 10 MG/ML IJ SOLN
10.0000 mg | Freq: Once | INTRAMUSCULAR | Status: AC
  Administered 2016-08-24: 10 mg via INTRAVENOUS
  Filled 2016-08-23: qty 1

## 2016-08-23 MED ORDER — PROMETHAZINE HCL 25 MG/ML IJ SOLN
6.2500 mg | INTRAMUSCULAR | Status: DC | PRN
  Administered 2016-08-23: 6.25 mg via INTRAVENOUS

## 2016-08-23 MED ORDER — ACETAMINOPHEN 650 MG RE SUPP: 650.0000 mg | Freq: Four times a day (QID) | RECTAL | Status: DC | PRN

## 2016-08-23 MED ORDER — FENTANYL CITRATE (PF) 100 MCG/2ML IJ SOLN
INTRAMUSCULAR | Status: DC | PRN
  Administered 2016-08-23: 100 ug via INTRAVENOUS

## 2016-08-23 SURGICAL SUPPLY — 55 items
BAG DECANTER FOR FLEXI CONT (MISCELLANEOUS) ×3 IMPLANT
BAG SPEC THK2 15X12 ZIP CLS (MISCELLANEOUS) ×2
BAG ZIPLOCK 12X15 (MISCELLANEOUS) ×3 IMPLANT
BANDAGE ACE 6X5 VEL STRL LF (GAUZE/BANDAGES/DRESSINGS) ×3 IMPLANT
BLADE SAG 18X100X1.27 (BLADE) ×3 IMPLANT
BLADE SAW SGTL 11.0X1.19X90.0M (BLADE) ×3 IMPLANT
BOWL SMART MIX CTS (DISPOSABLE) ×3 IMPLANT
CAPT KNEE TOTAL 3 ATTUNE ×1 IMPLANT
CEMENT HV SMART SET (Cement) ×6 IMPLANT
CLOTH BEACON ORANGE TIMEOUT ST (SAFETY) ×3 IMPLANT
CUFF TOURN SGL QUICK 34 (TOURNIQUET CUFF) ×3
CUFF TRNQT CYL 34X4X40X1 (TOURNIQUET CUFF) ×2 IMPLANT
DECANTER SPIKE VIAL GLASS SM (MISCELLANEOUS) ×3 IMPLANT
DRAPE U-SHAPE 47X51 STRL (DRAPES) ×3 IMPLANT
DRSG ADAPTIC 3X8 NADH LF (GAUZE/BANDAGES/DRESSINGS) ×3 IMPLANT
DRSG PAD ABDOMINAL 8X10 ST (GAUZE/BANDAGES/DRESSINGS) ×3 IMPLANT
DURAPREP 26ML APPLICATOR (WOUND CARE) ×3 IMPLANT
ELECT REM PT RETURN 9FT ADLT (ELECTROSURGICAL) ×3
ELECTRODE REM PT RTRN 9FT ADLT (ELECTROSURGICAL) ×2 IMPLANT
EVACUATOR 1/8 PVC DRAIN (DRAIN) ×3 IMPLANT
GAUZE SPONGE 4X4 12PLY STRL (GAUZE/BANDAGES/DRESSINGS) ×3 IMPLANT
GLOVE BIO SURGEON STRL SZ7.5 (GLOVE) ×1 IMPLANT
GLOVE BIO SURGEON STRL SZ8 (GLOVE) ×3 IMPLANT
GLOVE BIOGEL PI IND STRL 7.0 (GLOVE) IMPLANT
GLOVE BIOGEL PI IND STRL 7.5 (GLOVE) IMPLANT
GLOVE BIOGEL PI IND STRL 8 (GLOVE) ×2 IMPLANT
GLOVE BIOGEL PI INDICATOR 7.0 (GLOVE) ×1
GLOVE BIOGEL PI INDICATOR 7.5 (GLOVE) ×3
GLOVE BIOGEL PI INDICATOR 8 (GLOVE) ×2
GLOVE SURG SS PI 7.0 STRL IVOR (GLOVE) ×1 IMPLANT
GLOVE SURG SS PI 7.5 STRL IVOR (GLOVE) ×1 IMPLANT
GOWN STRL REUS W/ TWL XL LVL3 (GOWN DISPOSABLE) IMPLANT
GOWN STRL REUS W/TWL LRG LVL3 (GOWN DISPOSABLE) ×3 IMPLANT
GOWN STRL REUS W/TWL XL LVL3 (GOWN DISPOSABLE) ×5 IMPLANT
HANDPIECE INTERPULSE COAX TIP (DISPOSABLE) ×3
IMMOBILIZER KNEE 20 (SOFTGOODS) ×3
IMMOBILIZER KNEE 20 THIGH 36 (SOFTGOODS) ×2 IMPLANT
MANIFOLD NEPTUNE II (INSTRUMENTS) ×3 IMPLANT
NDL HYPO 25X1 1.5 SAFETY (NEEDLE) IMPLANT
NEEDLE HYPO 25X1 1.5 SAFETY (NEEDLE) ×3 IMPLANT
PACK TOTAL KNEE CUSTOM (KITS) ×3 IMPLANT
PAD ABD 8X10 STRL (GAUZE/BANDAGES/DRESSINGS) ×1 IMPLANT
PADDING CAST COTTON 6X4 STRL (CAST SUPPLIES) ×7 IMPLANT
POSITIONER SURGICAL ARM (MISCELLANEOUS) ×3 IMPLANT
SET HNDPC FAN SPRY TIP SCT (DISPOSABLE) ×2 IMPLANT
STRIP CLOSURE SKIN 1/2X4 (GAUZE/BANDAGES/DRESSINGS) ×5 IMPLANT
SUT MNCRL AB 4-0 PS2 18 (SUTURE) ×3 IMPLANT
SUT VIC AB 2-0 CT1 27 (SUTURE) ×9
SUT VIC AB 2-0 CT1 TAPERPNT 27 (SUTURE) ×6 IMPLANT
SUT VLOC 180 0 24IN GS25 (SUTURE) ×3 IMPLANT
SYR 3ML LL SCALE MARK (SYRINGE) ×1 IMPLANT
SYR 50ML LL SCALE MARK (SYRINGE) ×3 IMPLANT
TRAY FOLEY CATH SILVER 14FR (SET/KITS/TRAYS/PACK) ×1 IMPLANT
WRAP KNEE MAXI GEL POST OP (GAUZE/BANDAGES/DRESSINGS) ×3 IMPLANT
YANKAUER SUCT BULB TIP 10FT TU (MISCELLANEOUS) ×3 IMPLANT

## 2016-08-23 NOTE — Evaluation (Signed)
Physical Therapy Evaluation Patient Details Name: Kristin Mckinney MRN: [id number] DOB: 1956/04/25 Today's Date: 08/23/2016   History of Present Illness  Pt s/p L TKR   Clinical Impression  Pt s/p L TKR presents with decreased L LE strength/ROM and post op pain limiting functional mobility.  Pt should progress well to dc home with assist of family/friends and plans follow up PT as out-patient.    Follow Up Recommendations Outpatient PT    Equipment Recommendations  None recommended by PT    Recommendations for Other Services OT consult     Precautions / Restrictions Precautions Precautions: Knee;Fall Required Braces or Orthoses: Knee Immobilizer - Left Knee Immobilizer - Left: Discontinue once straight leg raise with < 10 degree lag Restrictions Weight Bearing Restrictions: No Other Position/Activity Restrictions: WBAT      Mobility  Bed Mobility Overal bed mobility: Needs Assistance Bed Mobility: Supine to Sit     Supine to sit: Min assist     General bed mobility comments: cues for sequence and use of R LE to self assist  Transfers Overall transfer level: Needs assistance   Transfers: Sit to/from Stand Sit to Stand: Min assist         General transfer comment: cues for LE management and use of UEs to self assist  Ambulation/Gait Ambulation/Gait assistance: Min assist Ambulation Distance (Feet): 26 Feet Assistive device: Rolling walker (2 wheeled) Gait Pattern/deviations: Step-to pattern;Decreased step length - right;Decreased step length - left;Shuffle;Trunk flexed Gait velocity: decr Gait velocity interpretation: Below normal speed for age/gender General Gait Details: cues for sequence, posture and position from [employer]  Stairs            Wheelchair Mobility    Modified Rankin (Stroke Patients Only)       Balance                                             Pertinent Vitals/Pain Pain Assessment: 0-10 Pain Score: 6  Pain  Location: L knee Pain Descriptors / Indicators: Aching;Sore Pain Intervention(s): Limited activity within patient's tolerance;Monitored during session;Premedicated before session;Ice applied    Home Living Family/patient expects to be discharged to:: Private residence Living Arrangements: Non-relatives/Friends Available Help at Discharge: Friend(s);Family;Available 24 hours/day Type of Home: House Home Access: Stairs to enter Entrance Stairs-Rails: Doctor, general practiceight;Left Entrance Stairs-Number of Steps: 7 Home Layout: One level Home Equipment: Walker - 2 wheels;Cane - quad      Prior Function Level of Independence: Independent               Hand Dominance        Extremity/Trunk Assessment   Upper Extremity Assessment: Overall WFL for tasks assessed           Lower Extremity Assessment: LLE deficits/detail      Cervical / Trunk Assessment: Normal  Communication   Communication: No difficulties  Cognition Arousal/Alertness: Awake/alert Behavior During Therapy: WFL for tasks assessed/performed Overall Cognitive Status: Within Functional Limits for tasks assessed                      General Comments      Exercises Total Joint Exercises Ankle Circles/Pumps: AROM;Both;15 reps;Supine   Assessment/Plan    PT Assessment Patient needs continued PT services  PT Problem List Decreased strength;Decreased range of motion;Decreased activity tolerance;Decreased mobility;Decreased knowledge of use of DME;Pain  PT Treatment Interventions DME instruction;Gait training;Stair training;Functional mobility training;Therapeutic activities;Therapeutic exercise;Patient/family education    PT Goals (Current goals can be found in the Care Plan section)  Acute Rehab PT Goals Patient Stated Goal: Regain IND PT Goal Formulation: With patient Time For Goal Achievement: 08/27/16 Potential to Achieve Goals: Good    Frequency 7X/week   Barriers to discharge         Co-evaluation               End of Session Equipment Utilized During Treatment: Gait belt;Left knee immobilizer Activity Tolerance: Patient tolerated treatment well Patient left: in chair;with call bell/phone within reach;with family/visitor present;with nursing/sitter in room Nurse Communication: Mobility status         Time: [id number] PT Time Calculation (min) (ACUTE ONLY): 24 min   Charges:   PT Evaluation $PT Eval Low Complexity: 1 Procedure PT Treatments $Gait Training: 8-22 mins   PT G Codes:        Maeola Mchaney 08/23/2016, 5:31 PM

## 2016-08-23 NOTE — Anesthesia Postprocedure Evaluation (Signed)
Anesthesia Post Note  Patient: Kristin Mckinney  Procedure(s) Performed: Procedure(s) (LRB): LEFT TOTAL KNEE ARTHROPLASTY (Left) RIGHT KNEE CORTISONE INJECTION (Right)  Patient location during evaluation: PACU Anesthesia Type: Spinal Level of consciousness: oriented and awake and alert Pain management: pain level controlled Vital Signs Assessment: post-procedure vital signs reviewed and stable Respiratory status: spontaneous breathing, respiratory function stable and patient connected to nasal cannula oxygen Cardiovascular status: blood pressure returned to baseline and stable Postop Assessment: no headache, no backache and spinal receding Anesthetic complications: no    Last Vitals:  Vitals:   08/23/16 1345 08/23/16 1500  BP: 121/76 120/74  Pulse: 67 75  Resp: 16 15  Temp: 36.7 C 36.8 C    Last Pain:  Vitals:   08/23/16 1330  TempSrc:   PainSc: 0-No pain                 Shelton SilvasKevin D Remiel Corti

## 2016-08-23 NOTE — Transfer of Care (Signed)
Immediate Anesthesia Transfer of Care Note  Patient: Kristin Mckinney  Procedure(s) Performed: Procedure(s): LEFT TOTAL KNEE ARTHROPLASTY (Left) RIGHT KNEE CORTISONE INJECTION (Right)  Patient Location: PACU  Anesthesia Type:Spinal  Level of Consciousness: awake, alert  and oriented  Airway & Oxygen Therapy: Patient Spontanous Breathing and Patient connected to face mask oxygen  Post-op Assessment: Report given to RN and Post -op Vital signs reviewed and stable  Post vital signs: Reviewed and stable  Last Vitals:  Vitals:   08/23/16 0738  BP: 133/78  Pulse: 77  Resp: 18  Temp: 36.7 C    Last Pain:  Vitals:   08/23/16 0738  TempSrc: Oral         Complications: No apparent anesthesia complications

## 2016-08-23 NOTE — Anesthesia Procedure Notes (Addendum)
Spinal  Start time: 08/23/2016 10:10 AM End time: 08/23/2016 10:17 AM Staffing Anesthesiologist: Shona SimpsonHOLLIS, Kristin Guinn D Performed: anesthesiologist  Preanesthetic Checklist Completed: patient identified, site marked, surgical consent, pre-op evaluation, timeout performed, IV checked, risks and benefits discussed and monitors and equipment checked Spinal Block Patient position: sitting Prep: Betadine and site prepped and draped Patient monitoring: heart rate, continuous pulse ox and blood pressure Approach: midline Location: L3-4 Injection technique: single-shot Needle Needle type: Sprotte  Needle gauge: 24 G Needle length: 9 cm Needle insertion depth: 6 cm

## 2016-08-23 NOTE — H&P (View-Only) (Signed)
NEW UPDATE CONSENT ADDED  A new updated surgical consent was added to include the right knee cortisone injection along with the scheduled left TKA to be performed on 08/23/2016. Avel Peacerew Declin Rajan, PA-C

## 2016-08-23 NOTE — Op Note (Addendum)
OPERATIVE REPORT-TOTAL KNEE ARTHROPLASTY   Pre-operative diagnosis- Osteoarthritis  Bilateral knee(s)  Post-operative diagnosis- Osteoarthritis Bilateral knee(s)  Procedure-  Left  Total Knee Arthroplasty   Right Knee cortisone injection  Surgeon- Gus RankinFrank V. Kenzlei Runions, MD  Assistant- Avel Peacerew Perkins, PA-C   Anesthesia-  Spinal  EBL-* No blood loss amount entered *   Drains Hemovac  Tourniquet time- 32 minutes @ 300 mm Hg   Complications- None  Condition-PACU - hemodynamically stable.   Brief Clinical Note  Kristin Mckinney is a 60 y.o. year old female with end stage OA of her left knee with progressively worsening pain and dysfunction. She has constant pain, with activity and at rest and significant functional deficits with difficulties even with ADLs. She has had extensive non-op management including analgesics, injections of cortisone and viscosupplements, and home exercise program, but remains in significant pain with significant dysfunction. Radiographs show bone on bone arthritis medial and patellofemoral. She presents now for left Total Knee Arthroplasty.  She also has advanced arthritis of the right knee and requests cortisone injection.  Procedure in detail---   The patient is brought into the operating room and positioned supine on the operating table. After successful administration of  Spinal,   a tourniquet is placed high on the  Left thigh(s) and the lower extremity is prepped and draped in the usual sterile fashion. Time out is performed by the operating team and then the  Left lower extremity is wrapped in Esmarch, knee flexed and the tourniquet inflated to 300 mmHg.       A midline incision is made with a ten blade through the subcutaneous tissue to the level of the extensor mechanism. A fresh blade is used to make a medial parapatellar arthrotomy. Soft tissue over the proximal medial tibia is subperiosteally elevated to the joint line with a knife and into the  semimembranosus bursa with a Cobb elevator. Soft tissue over the proximal lateral tibia is elevated with attention being paid to avoiding the patellar tendon on the tibial tubercle. The patella is everted, knee flexed 90 degrees and the ACL and PCL are removed. Findings are bone on bone medial and patellofemoral with large global osteophytes.        The drill is used to create a starting hole in the distal femur and the canal is thoroughly irrigated with sterile saline to remove the fatty contents. The 5 degree Left  valgus alignment guide is placed into the femoral canal and the distal femoral cutting block is pinned to remove 9 mm off the distal femur. Resection is made with an oscillating saw.      The tibia is subluxed forward and the menisci are removed. The extramedullary alignment guide is placed referencing proximally at the medial aspect of the tibial tubercle and distally along the second metatarsal axis and tibial crest. The block is pinned to remove 2mm off the more deficient medial  side. Resection is made with an oscillating saw. Size 4is the most appropriate size for the tibia and the proximal tibia is prepared with the modular drill and keel punch for that size.      The femoral sizing guide is placed and size 6 is most appropriate. Rotation is marked off the epicondylar axis and confirmed by creating a rectangular flexion gap at 90 degrees. The size 6 cutting block is pinned in this rotation and the anterior, posterior and chamfer cuts are made with the oscillating saw. The intercondylar block is then placed and that cut  is made.      Trial size 4 tibial component, trial size 6 narrow posterior stabilized femur and a 6  mm posterior stabilized rotating platform insert trial is placed. Full extension is achieved with excellent varus/valgus and anterior/posterior balance throughout full range of motion. The patella is everted and thickness measured to be 24  mm. Free hand resection is taken to 14  mm, a 38 template is placed, lug holes are drilled, trial patella is placed, and it tracks normally. Osteophytes are removed off the posterior femur with the trial in place. All trials are removed and the cut bone surfaces prepared with pulsatile lavage. Cement is mixed and once ready for implantation, the size 4 tibial implant, size  6 narrow posterior stabilized femoral component, and the size 38 patella are cemented in place and the patella is held with the clamp. The trial insert is placed and the knee held in full extension. The Exparel (20 ml mixed with 30 ml saline) and .25% Bupivicaine, are injected into the extensor mechanism, posterior capsule, medial and lateral gutters and subcutaneous tissues.  All extruded cement is removed and once the cement is hard the permanent 6 mm posterior stabilized rotating platform insert is placed into the tibial tray.      The wound is copiously irrigated with saline solution and the extensor mechanism closed over a hemovac drain with #1 V-loc suture. The tourniquet is released for a total tourniquet time of 32  minutes. Flexion against gravity is 140 degrees and the patella tracks normally. Subcutaneous tissue is closed with 2.0 vicryl and subcuticular with running 4.0 Monocryl. The incision is cleaned and dried and steri-strips and a bulky sterile dressing are applied. The limb is placed into a knee immobilizer.      I then prepped the right knee with Betadine and injected the knee with 2 ml (80 mg) of Depomedrol. The patient is then awakened and transported to recovery in stable condition.      Please note that a surgical assistant was a medical necessity for this procedure in order to perform it in a safe and expeditious manner. Surgical assistant was necessary to retract the ligaments and vital neurovascular structures to prevent injury to them and also necessary for proper positioning of the limb to allow for anatomic placement of the prosthesis.   Gus RankinFrank V.  Kairen Hallinan, MD    08/23/2016, 11:13 AM

## 2016-08-23 NOTE — Interval H&P Note (Signed)
History and Physical Interval Note:  08/23/2016 9:16 AM  Kristin Mckinney  has presented today for surgery, with the diagnosis of LEFT KNEE OA  The various methods of treatment have been discussed with the patient and family. After consideration of risks, benefits and other options for treatment, the patient has consented to  Procedure(s): LEFT TOTAL KNEE ARTHROPLASTY (Left) as a surgical intervention .  The patient's history has been reviewed, patient examined, no change in status, stable for surgery.  I have reviewed the patient's chart and labs.  Questions were answered to the patient's satisfaction.     Loanne DrillingALUISIO,Jessamy Torosyan V

## 2016-08-23 NOTE — Anesthesia Preprocedure Evaluation (Addendum)
Anesthesia Evaluation  Patient identified by MRN, date of birth, ID band Patient awake    Reviewed: Allergy & Precautions, NPO status , Patient's Chart, lab work & pertinent test results  Airway Mallampati: II  TM Distance: >3 FB Neck ROM: Full    Dental  (+) Teeth Intact, Dental Advisory Given   Pulmonary neg pulmonary ROS,    breath sounds clear to auscultation       Cardiovascular negative cardio ROS   Rhythm:Regular Rate:Normal     Neuro/Psych negative neurological ROS  negative psych ROS   GI/Hepatic negative GI ROS, Neg liver ROS,   Endo/Other  negative endocrine ROS  Renal/GU negative Renal ROS  negative genitourinary   Musculoskeletal  (+) Arthritis , Osteoarthritis,    Abdominal   Peds negative pediatric ROS (+)  Hematology negative hematology ROS (+)   Anesthesia Other Findings   Reproductive/Obstetrics negative OB ROS                            Lab Results  Component Value Date   WBC 5.8 08/16/2016   HGB 12.6 08/16/2016   HCT 38.5 08/16/2016   MCV 89.1 08/16/2016   PLT 218 08/16/2016   Lab Results  Component Value Date   CREATININE 0.74 08/16/2016   BUN 15 08/16/2016   NA 139 08/16/2016   K 4.2 08/16/2016   CL 102 08/16/2016   CO2 29 08/16/2016   Lab Results  Component Value Date   INR 0.92 08/16/2016     Anesthesia Physical Anesthesia Plan  ASA: II  Anesthesia Plan: Spinal   Post-op Pain Management:    Induction: Intravenous  Airway Management Planned: Natural Airway  Additional Equipment:   Intra-op Plan:   Post-operative Plan:   Informed Consent: I have reviewed the patients History and Physical, chart, labs and discussed the procedure including the risks, benefits and alternatives for the proposed anesthesia with the patient or authorized representative who has indicated his/her understanding and acceptance.     Plan Discussed with:  CRNA  Anesthesia Plan Comments:         Anesthesia Quick Evaluation

## 2016-08-23 NOTE — Anesthesia Procedure Notes (Signed)
Spinal

## 2016-08-24 LAB — BASIC METABOLIC PANEL
Anion gap: 7 (ref 5–15)
BUN: 13 mg/dL (ref 6–20)
CO2: 24 mmol/L (ref 22–32)
CREATININE: 0.59 mg/dL (ref 0.44–1.00)
Calcium: 8.9 mg/dL (ref 8.9–10.3)
Chloride: 109 mmol/L (ref 101–111)
GFR calc Af Amer: >60 mL/min (ref >60)
GLUCOSE: 178 mg/dL — AB (ref 65–99)
Potassium: 4 mmol/L (ref 3.5–5.1)
SODIUM: 140 mmol/L (ref 135–145)

## 2016-08-24 LAB — CBC
HCT: 32.8 % — ABNORMAL LOW (ref 36.0–46.0)
Hemoglobin: 10.8 g/dL — ABNORMAL LOW (ref 12.0–15.0)
MCH: 28.6 pg (ref 26.0–34.0)
MCHC: 32.9 g/dL (ref 30.0–36.0)
MCV: 87 fL (ref 78.0–100.0)
PLATELETS: 200 10*3/uL (ref 150–400)
RBC: 3.77 MIL/uL — ABNORMAL LOW (ref 3.87–5.11)
RDW: 13.6 % (ref 11.5–15.5)
WBC: 16.3 10*3/uL — ABNORMAL HIGH (ref 4.0–10.5)

## 2016-08-24 MED ORDER — SODIUM CHLORIDE 0.9 % IV BOLUS (SEPSIS)
250.0000 mL | Freq: Once | INTRAVENOUS | Status: AC
  Administered 2016-08-24: 250 mL via INTRAVENOUS

## 2016-08-24 MED ORDER — HYDROMORPHONE HCL 2 MG PO TABS: 2.0000 mg | ORAL_TABLET | ORAL | 0 refills | Status: AC | PRN

## 2016-08-24 MED ORDER — TRAMADOL HCL 50 MG PO TABS: 50.0000 mg | ORAL_TABLET | Freq: Four times a day (QID) | ORAL | 1 refills | Status: AC | PRN

## 2016-08-24 MED ORDER — RIVAROXABAN 10 MG PO TABS: 10.0000 mg | ORAL_TABLET | Freq: Every day | ORAL | 0 refills | Status: AC

## 2016-08-24 MED ORDER — CYCLOBENZAPRINE HCL 10 MG PO TABS: 10.0000 mg | ORAL_TABLET | Freq: Three times a day (TID) | ORAL | 0 refills | Status: AC | PRN

## 2016-08-24 NOTE — Discharge Instructions (Addendum)
° °Dr. Frank Aluisio °Total Joint Specialist °[facility] °[address] °[place], [place] [address] °[phone] ° °TOTAL KNEE REPLACEMENT POSTOPERATIVE DIRECTIONS ° °Knee Rehabilitation, Guidelines Following Surgery  °Results after knee surgery are often greatly improved when you follow the exercise, range of motion and muscle strengthening exercises prescribed by your doctor. Safety measures are also important to protect the knee from further injury. Any time any of these exercises cause you to have increased pain or swelling in your knee joint, decrease the amount until you are comfortable again and slowly increase them. If you have problems or questions, call your caregiver or physical therapist for advice.  ° °HOME CARE INSTRUCTIONS  °Remove items at home which could result in a fall. This includes throw rugs or furniture in walking pathways.  °· ICE to the affected knee every three hours for 30 minutes at a time and then as needed for pain and swelling.  Continue to use ice on the knee for pain and swelling from surgery. You may notice swelling that will progress down to the foot and ankle.  This is normal after surgery.  Elevate the leg when you are not up walking on it.   °· Continue to use the breathing machine which will help keep your temperature down.  It is common for your temperature to cycle up and down following surgery, especially at night when you are not up moving around and exerting yourself.  The breathing machine keeps your lungs expanded and your temperature down. °· Do not place pillow under knee, focus on keeping the knee straight while resting ° °DIET °You may resume your previous home diet once your are discharged from the hospital. ° °DRESSING / WOUND CARE / SHOWERING °You may shower 3 days after surgery, but keep the wounds dry during showering.  You may use an occlusive plastic wrap (Press'n Seal for example), NO SOAKING/SUBMERGING IN THE BATHTUB.  If the  bandage gets wet, change with a clean dry gauze.  If the incision gets wet, pat the wound dry with a clean towel. °You may start showering once you are discharged home but do not submerge the incision under water. Just pat the incision dry and apply a dry gauze dressing on daily. °Change the surgical dressing daily and reapply a dry dressing each time. ° °ACTIVITY °Walk with your walker as instructed. °Use walker as long as suggested by your caregivers. °Avoid periods of inactivity such as sitting longer than an hour when not asleep. This helps prevent blood clots.  °You may resume a sexual relationship in one month or when given the OK by your doctor.  °You may return to work once you are cleared by your doctor.  °Do not drive a car for 6 weeks or until released by you surgeon.  °Do not drive while taking narcotics. ° °WEIGHT BEARING °Weight bearing as tolerated with assist device (walker, cane, etc) as directed, use it as long as suggested by your surgeon or therapist, typically at least 4-6 weeks. ° °POSTOPERATIVE CONSTIPATION PROTOCOL °Constipation - defined medically as fewer than three stools per week and severe constipation as less than one stool per week. ° °One of the most common issues patients have following surgery is constipation.  Even if you have a regular bowel pattern at home, your normal regimen is likely to be disrupted due to multiple reasons following surgery.  Combination of anesthesia, postoperative narcotics, change in appetite and fluid intake all can affect your bowels.    In order to avoid complications following surgery, here are some recommendations in order to help you during your recovery period. ° °Colace (docusate) - Pick up an over-the-counter form of Colace or another stool softener and take twice a day as long as you are requiring postoperative pain medications.  Take with a full glass of water daily.  If you experience loose stools or diarrhea, hold the colace until you stool forms  back up.  If your symptoms do not get better within 1 week or if they get worse, check with your doctor. ° °Dulcolax (bisacodyl) - Pick up over-the-counter and take as directed by the product packaging as needed to assist with the movement of your bowels.  Take with a full glass of water.  Use this product as needed if not relieved by Colace only.  ° °MiraLax (polyethylene glycol) - Pick up over-the-counter to have on hand.  MiraLax is a solution that will increase the amount of water in your bowels to assist with bowel movements.  Take as directed and can mix with a glass of water, juice, soda, coffee, or tea.  Take if you go more than two days without a movement. °Do not use MiraLax more than once per day. Call your doctor if you are still constipated or irregular after using this medication for 7 days in a row. ° °If you continue to have problems with postoperative constipation, please contact the office for further assistance and recommendations.  If you experience "the worst abdominal pain ever" or develop nausea or vomiting, please contact the office immediatly for further recommendations for treatment. ° °ITCHING ° If you experience itching with your medications, try taking only a single pain pill, or even half a pain pill at a time.  You can also use Benadryl over the counter for itching or also to help with sleep.  ° °TED HOSE STOCKINGS °Wear the elastic stockings on both legs for three weeks following surgery during the day but you may remove then at night for sleeping. ° °MEDICATIONS °See your medication summary on the “After Visit Summary” that the nursing staff will review with you prior to discharge.  You may have some home medications which will be placed on hold until you complete the course of blood thinner medication.  It is important for you to complete the blood thinner medication as prescribed by your surgeon.  Continue your approved medications as instructed at time of  discharge. ° °PRECAUTIONS °If you experience chest pain or shortness of breath - call 911 immediately for transfer to the hospital emergency department.  °If you develop a fever greater that 101 F, purulent drainage from wound, increased redness or drainage from wound, foul odor from the wound/dressing, or calf pain - CONTACT YOUR SURGEON.   °                                                °FOLLOW-UP APPOINTMENTS °Make sure you keep all of your appointments after your operation with your surgeon and caregivers. You should call the office at the above phone number and make an appointment for approximately two weeks after the date of your surgery or on the date instructed by your surgeon outlined in the "After Visit Summary". ° ° °RANGE OF MOTION AND STRENGTHENING EXERCISES  °Rehabilitation of the knee is important following a knee injury or   an operation. After just a few days of immobilization, the muscles of the thigh which control the knee become weakened and shrink (atrophy). Knee exercises are designed to build up the tone and strength of the thigh muscles and to improve knee motion. Often times heat used for twenty to thirty minutes before working out will loosen up your tissues and help with improving the range of motion but do not use heat for the first two weeks following surgery. These exercises can be done on a training (exercise) mat, on the floor, on a table or on a bed. Use what ever works the best and is most comfortable for you Knee exercises include:  °Leg Lifts - While your knee is still immobilized in a splint or cast, you can do straight leg raises. Lift the leg to 60 degrees, hold for 3 sec, and slowly lower the leg. Repeat 10-20 times 2-3 times daily. Perform this exercise against resistance later as your knee gets better.  °Quad and Hamstring Sets - Tighten up the muscle on the front of the thigh (Quad) and hold for 5-10 sec. Repeat this 10-20 times hourly. Hamstring sets are done by pushing the  foot backward against an object and holding for 5-10 sec. Repeat as with quad sets.  °· Leg Slides: Lying on your back, slowly slide your foot toward your buttocks, bending your knee up off the floor (only go as far as is comfortable). Then slowly slide your foot back down until your leg is flat on the floor again. °· Angel Wings: Lying on your back spread your legs to the side as far apart as you can without causing discomfort.  °A rehabilitation program following serious knee injuries can speed recovery and prevent re-injury in the future due to weakened muscles. Contact your doctor or a physical therapist for more information on knee rehabilitation.  ° °IF YOU ARE TRANSFERRED TO A SKILLED REHAB FACILITY °If the patient is transferred to a skilled rehab facility following release from the hospital, a list of the current medications will be sent to the facility for the patient to continue.  When discharged from the skilled rehab facility, please have the facility set up the patient's Home Health Physical Therapy prior to being released. Also, the skilled facility will be responsible for providing the patient with their medications at time of release from the facility to include their pain medication, the muscle relaxants, and their blood thinner medication. If the patient is still at the rehab facility at time of the two week follow up appointment, the skilled rehab facility will also need to assist the patient in arranging follow up appointment in our office and any transportation needs. ° °MAKE SURE YOU:  °Understand these instructions.  °Get help right away if you are not doing well or get worse.  ° ° °Pick up stool softner and laxative for home use following surgery while on pain medications. °Do not submerge incision under water. °Please use good hand washing techniques while changing dressing each day. °May shower starting three days after surgery. °Please use a clean towel to pat the incision dry following  showers. °Continue to use ice for pain and swelling after surgery. °Do not use any lotions or creams on the incision until instructed by your surgeon. ° °Take Xarelto for two and a half more weeks, then discontinue Xarelto. °Once the patient has completed the blood thinner regimen, then take a Baby 81 mg Aspirin daily for three more weeks. ° ° °Information   on my medicine - XARELTO® (Rivaroxaban) ° °This medication education was reviewed with me or my healthcare representative as part of my discharge preparation.  The pharmacist that spoke with me during my hospital stay was:  Mikele Sifuentes, RPH ° °Why was Xarelto® prescribed for you? °Xarelto® was prescribed for you to reduce the risk of blood clots forming after orthopedic surgery. The medical term for these abnormal blood clots is venous thromboembolism (VTE). ° °What do you need to know about xarelto® ? °Take your Xarelto® ONCE DAILY at the same time every day. °You may take it either with or without food. ° °If you have difficulty swallowing the tablet whole, you may crush it and mix in applesauce just prior to taking your dose. ° °Take Xarelto® exactly as prescribed by your doctor and DO NOT stop taking Xarelto® without talking to the doctor who prescribed the medication.  Stopping without other VTE prevention medication to take the place of Xarelto® may increase your risk of developing a clot. ° °After discharge, you should have regular check-up appointments with your healthcare provider that is prescribing your Xarelto®.   ° °What do you do if you miss a dose? °If you miss a dose, take it as soon as you remember on the same day then continue your regularly scheduled once daily regimen the next day. Do not take two doses of Xarelto® on the same day.  ° °Important Safety Information °A possible side effect of Xarelto® is bleeding. You should call your healthcare provider right away if you experience any of the following: °? Bleeding from an injury or your  nose that does not stop. °? Unusual colored urine (red or dark brown) or unusual colored stools (red or black). °? Unusual bruising for unknown reasons. °? A serious fall or if you hit your head (even if there is no bleeding). ° °Some medicines may interact with Xarelto® and might increase your risk of bleeding while on Xarelto®. To help avoid this, consult your healthcare provider or pharmacist prior to using any new prescription or non-prescription medications, including herbals, vitamins, non-steroidal anti-inflammatory drugs (NSAIDs) and supplements. ° °This website has more information on Xarelto®: www.xarelto.com. ° ° °

## 2016-08-24 NOTE — Care Management Note (Signed)
Case Management Note  Patient Details  Name: MANESSA BULEY MRN: [id number] Date of Birth: Feb 14, 1956  Subjective/Objective:                  LEFT TOTAL KNEE ARTHROPLASTY (Left) RIGHT KNEE CORTISONE INJECTION (Right) Action/Plan: Discharge  Expected Discharge Date:                  Expected Discharge Plan:  [place]  In-House Referral:     Discharge planning Services  CM Consult  Post Acute Care Choice:    Choice offered to:  Patient  DME Arranged:  N/A DME Agency:  NA  HH Arranged:  PT [place] Agency:  [facility]  Status of Service:  Completed, signed off  If discussed at [place] of Stay Meetings, dates discussed:    Additional Comments: CM met with pt to offer choice of home health agency. Pt chooses [facility] to rneder [facility].  Referral called to [facility] rep, Manuela Schwartz.  Pt states she has all needed DME at home.  No other CM needs were communicated. Dellie Catholic, RN 08/24/2016, 1:14 PM

## 2016-08-24 NOTE — Progress Notes (Signed)
Physical Therapy Treatment Patient Details Name: Kristin Mckinney MRN: [id number] DOB: 11-Nov-1955 Today's Date: 08/24/2016    History of Present Illness Pt s/p L TKR     PT Comments    Pt very motivated and progressing well with mobility.  Follow Up Recommendations  Home health PT     Equipment Recommendations  None recommended by PT    Recommendations for Other Services OT consult     Precautions / Restrictions Precautions Precautions: Knee;Fall Required Braces or Orthoses: Knee Immobilizer - Left Knee Immobilizer - Left: Discontinue once straight leg raise with < 10 degree lag (Pt performed IND SLR this am) Restrictions Weight Bearing Restrictions: No Other Position/Activity Restrictions: WBAT    Mobility  Bed Mobility Overal bed mobility: Needs Assistance Bed Mobility: Supine to Sit     Supine to sit: Min guard     General bed mobility comments: cues for sequence and use of R LE to self assist  Transfers Overall transfer level: Needs assistance Equipment used: Rolling walker (2 wheeled) Transfers: Sit to/from Stand Sit to Stand: Min guard         General transfer comment: cues for LE management and use of UEs to self assist  Ambulation/Gait Ambulation/Gait assistance: Min guard Ambulation Distance (Feet): 75 Feet Assistive device: Rolling walker (2 wheeled) Gait Pattern/deviations: Step-to pattern;Decreased step length - right;Decreased step length - left;Shuffle;Trunk flexed Gait velocity: decr Gait velocity interpretation: Below normal speed for age/gender General Gait Details: min cues for sequence, posture and position from [employer]   Stairs            Wheelchair Mobility    Modified Rankin (Stroke Patients Only)       Balance                                    Cognition Arousal/Alertness: Awake/alert Behavior During Therapy: WFL for tasks assessed/performed Overall Cognitive Status: Within Functional Limits for tasks  assessed                      Exercises Total Joint Exercises Ankle Circles/Pumps: AROM;Both;15 reps;Supine Quad Sets: AROM;Both;10 reps;Supine Heel Slides: AAROM;Left;Supine;15 reps Straight Leg Raises: AAROM;AROM;Left;Other reps (comment);Supine (12) Goniometric ROM: AAROM at L knee -8 - 45    General Comments        Pertinent Vitals/Pain Pain Assessment: 0-10 Pain Score: 6  Pain Location: L knee Pain Descriptors / Indicators: Aching;Sore Pain Intervention(s): Limited activity within patient's tolerance;Monitored during session;Premedicated before session;Ice applied    Home Living                      Prior Function            PT Goals (current goals can now be found in the care plan section) Acute Rehab PT Goals Patient Stated Goal: Regain IND PT Goal Formulation: With patient Time For Goal Achievement: 08/27/16 Potential to Achieve Goals: Good Progress towards PT goals: Progressing toward goals    Frequency    7X/week      PT Plan Discharge plan needs to be updated    Co-evaluation             End of Session Equipment Utilized During Treatment: Gait belt Activity Tolerance: Patient tolerated treatment well Patient left: in chair;with call bell/phone within reach;with nursing/sitter in room     Time: 0902-0939 PT Time Calculation (min) (ACUTE ONLY):  37 min  Charges:  $Gait Training: 8-22 mins $Therapeutic Exercise: 8-22 mins                    G Codes:      Kristin Mckinney 08/24/2016, 11:57 AM

## 2016-08-24 NOTE — Progress Notes (Signed)
   Subjective: 1 Day Post-Op Procedure(s) (LRB): LEFT TOTAL KNEE ARTHROPLASTY (Left) RIGHT KNEE CORTISONE INJECTION (Right) Patient reports pain as mild.   Patient seen in rounds for Dr. Lequita HaltAluisio. Walked 26 feet the day of surgery. Patient is well, but has had some minor complaints of pain in the knee, requiring pain medications We will start therapy today.  If they do well with therapy and meets all goals, then will allow home later this afternoon following therapy if doing well and meets all goals. Plan is to go Home after hospital stay.  Objective: Vital signs in last 24 hours: Temp:  [97.6 F (36.4 C)-98.4 F (36.9 C)] 98.2 F (36.8 C) (11/14 0606) Pulse Rate:  [57-91] 71 (11/14 0606) Resp:  [12-21] 17 (11/14 0606) BP: (79-121)/(51-91) 94/54 (11/14 0606) SpO2:  [97 %-100 %] 99 % (11/14 0606)  Intake/Output from previous day:  Intake/Output Summary (Last 24 hours) at 08/24/16 0850 Last data filed at 08/24/16 0848  Gross per 24 hour  Intake          3798.75 ml  Output             3350 ml  Net           448.75 ml    Intake/Output this shift: Total I/O In: 240 [P.O.:240] Out: -   Labs:  Recent Labs  08/24/16 0359  HGB 10.8*    Recent Labs  08/24/16 0359  WBC 16.3*  RBC 3.77*  HCT 32.8*  PLT 200    Recent Labs  08/24/16 0359  NA 140  K 4.0  CL 109  CO2 24  BUN 13  CREATININE 0.59  GLUCOSE 178*  CALCIUM 8.9   No results for input(s): LABPT, INR in the last 72 hours.  EXAM General - Patient is Alert, Appropriate and Oriented Extremity - Neurovascular intact Sensation intact distally Intact pulses distally Dorsiflexion/Plantar flexion intact Dressing - dressing C/D/I Motor Function - intact, moving foot and toes well on exam.  Hemovac pulled without difficulty.  Past Medical History:  Diagnosis Date  . Arthritis    osteoarthritis -knees  . Pneumonia    Jan 2017- no residual problems    Assessment/Plan: 1 Day Post-Op Procedure(s)  (LRB): LEFT TOTAL KNEE ARTHROPLASTY (Left) RIGHT KNEE CORTISONE INJECTION (Right) Principal Problem:   OA (osteoarthritis) of knee  Estimated body mass index is 26.95 kg/m as calculated from the following:   Height as of this encounter: 5\' 6"  (1.676 m).   Weight as of this encounter: 75.8 kg (167 lb). Advance diet Up with therapy Discharge home with home health  DVT Prophylaxis - Xarelto Weight-Bearing as tolerated to left leg D/C O2 and Pulse OX and try on Room Air  If meets goals and able to go home: Up with therapy Discharge home with home health Diet - Regular diet Follow up - in 2 weeks Activity - WBAT Disposition - Home Condition Upon Discharge - Good D/C Meds - See DC Summary DVT Prophylaxis - Xarelto  Avel Peacerew Kealani Leckey, PA-C Orthopaedic Surgery

## 2016-08-24 NOTE — Progress Notes (Signed)
OT Cancellation Note  Patient Details Name: Caleen JobsJayne P Noguera MRN: [id number] DOB: [birth year]   Cancelled Treatment:    Reason Eval/Treat Not Completed: OT screened, no needs identified, will sign off  Briston Lax 08/24/2016, 10:51 AM  Marica OtterMaryellen Aldine Chakraborty, OTR/L [phone] 08/24/2016

## 2016-08-24 NOTE — Discharge Summary (Signed)
Physician Discharge Summary   Patient ID: Kristin Mckinney MRN: [id number] DOB/AGE: 1956-02-05 60 y.o.  Admit date: 08/23/2016 Discharge date: 08-25-2016  Primary Diagnosis:  Osteoarthritis  Bilateral knee(s) Admission Diagnoses:  Past Medical History:  Diagnosis Date  . Arthritis    osteoarthritis -knees  . Pneumonia    Jan 2017- no residual problems   Discharge Diagnoses:   Principal Problem:   OA (osteoarthritis) of knee  Estimated body mass index is 26.95 kg/m as calculated from the following:   Height as of this encounter: 5' 6"  (1.676 m).   Weight as of this encounter: 75.8 kg (167 lb).  Procedure:  Procedure(s) (LRB): LEFT TOTAL KNEE ARTHROPLASTY (Left) RIGHT KNEE CORTISONE INJECTION (Right)   Consults: None  HPI: Kristin Mckinney is a 60 y.o. year old female with end stage OA of her left knee with progressively worsening pain and dysfunction. She has constant pain, with activity and at rest and significant functional deficits with difficulties even with ADLs. She has had extensive non-op management including analgesics, injections of cortisone and viscosupplements, and home exercise program, but remains in significant pain with significant dysfunction. Radiographs show bone on bone arthritis medial and patellofemoral. She presents now for left Total Knee Arthroplasty.  She also has advanced arthritis of the right knee and requests cortisone injection. Laboratory Data: Admission on 08/23/2016  Component Date Value Ref Range Status  . ABO/RH(D) 08/17/2016 O POS   Final  . WBC 08/24/2016 16.3* 4.0 - 10.5 K/uL Final  . RBC 08/24/2016 3.77* 3.87 - 5.11 MIL/uL Final  . Hemoglobin 08/24/2016 10.8* 12.0 - 15.0 g/dL Final  . HCT 08/24/2016 32.8* 36.0 - 46.0 % Final  . MCV 08/24/2016 87.0  78.0 - 100.0 fL Final  . MCH 08/24/2016 28.6  26.0 - 34.0 pg Final  . MCHC 08/24/2016 32.9  30.0 - 36.0 g/dL Final  . RDW 08/24/2016 13.6  11.5 - 15.5 % Final  . Platelets 08/24/2016 200  150  - 400 K/uL Final  . Sodium 08/24/2016 140  135 - 145 mmol/L Final  . Potassium 08/24/2016 4.0  3.5 - 5.1 mmol/L Final  . Chloride 08/24/2016 109  101 - 111 mmol/L Final  . CO2 08/24/2016 24  22 - 32 mmol/L Final  . Glucose, Bld 08/24/2016 178* 65 - 99 mg/dL Final  . BUN 08/24/2016 13  6 - 20 mg/dL Final  . Creatinine, Ser 08/24/2016 0.59  0.44 - 1.00 mg/dL Final  . Calcium 08/24/2016 8.9  8.9 - 10.3 mg/dL Final  . GFR calc non Af Amer 08/24/2016 >60  >60 mL/min Final  . GFR calc Af Amer 08/24/2016 >60  >60 mL/min Final   Comment: (NOTE) The eGFR has been calculated using the CKD EPI equation. This calculation has not been validated in all clinical situations. eGFR's persistently <60 mL/min signify possible Chronic Kidney Disease.   Georgiann Hahn gap 08/24/2016 7  5 - 15 Final  Hospital Outpatient Visit on 08/16/2016  Component Date Value Ref Range Status  . MRSA, PCR 08/16/2016 NEGATIVE  NEGATIVE Final  . Staphylococcus aureus 08/16/2016 NEGATIVE  NEGATIVE Final   Comment:        The Xpert SA Assay (FDA approved for NASAL specimens in patients over 29 years of age), is one component of a comprehensive surveillance program.  Test performance has been validated by [facility] for patients greater than or equal to 31 year old. It is not intended to diagnose infection nor to guide or monitor treatment.   Marland Kitchen  aPTT 08/16/2016 28  24 - 36 seconds Final  . WBC 08/16/2016 5.8  4.0 - 10.5 K/uL Final  . RBC 08/16/2016 4.32  3.87 - 5.11 MIL/uL Final  . Hemoglobin 08/16/2016 12.6  12.0 - 15.0 g/dL Final  . HCT 08/16/2016 38.5  36.0 - 46.0 % Final  . MCV 08/16/2016 89.1  78.0 - 100.0 fL Final  . MCH 08/16/2016 29.2  26.0 - 34.0 pg Final  . MCHC 08/16/2016 32.7  30.0 - 36.0 g/dL Final  . RDW 08/16/2016 14.0  11.5 - 15.5 % Final  . Platelets 08/16/2016 218  150 - 400 K/uL Final  . Sodium 08/16/2016 139  135 - 145 mmol/L Final  . Potassium 08/16/2016 4.2  3.5 - 5.1 mmol/L Final  . Chloride  08/16/2016 102  101 - 111 mmol/L Final  . CO2 08/16/2016 29  22 - 32 mmol/L Final  . Glucose, Bld 08/16/2016 97  65 - 99 mg/dL Final  . BUN 08/16/2016 15  6 - 20 mg/dL Final  . Creatinine, Ser 08/16/2016 0.74  0.44 - 1.00 mg/dL Final  . Calcium 08/16/2016 9.6  8.9 - 10.3 mg/dL Final  . Total Protein 08/16/2016 7.2  6.5 - 8.1 g/dL Final  . Albumin 08/16/2016 4.2  3.5 - 5.0 g/dL Final  . AST 08/16/2016 23  15 - 41 U/L Final  . ALT 08/16/2016 10* 14 - 54 U/L Final  . Alkaline Phosphatase 08/16/2016 90  38 - 126 U/L Final  . Total Bilirubin 08/16/2016 0.5  0.3 - 1.2 mg/dL Final  . GFR calc non Af Amer 08/16/2016 >60  >60 mL/min Final  . GFR calc Af Amer 08/16/2016 >60  >60 mL/min Final   Comment: (NOTE) The eGFR has been calculated using the CKD EPI equation. This calculation has not been validated in all clinical situations. eGFR's persistently <60 mL/min signify possible Chronic Kidney Disease.   . Anion gap 08/16/2016 8  5 - 15 Final  . Prothrombin Time 08/16/2016 12.4  11.4 - 15.2 seconds Final  . INR 08/16/2016 0.92   Final  . ABO/RH(D) 08/23/2016 O POS   Final  . Antibody Screen 08/23/2016 NEG   Final  . Sample Expiration 08/23/2016 08/26/2016   Final  . Extend sample reason 08/23/2016 NO TRANSFUSIONS OR PREGNANCY IN THE PAST 3 MONTHS   Final  . Color, Urine 08/16/2016 YELLOW  YELLOW Final  . APPearance 08/16/2016 CLOUDY* CLEAR Final  . Specific Gravity, Urine 08/16/2016 1.015  1.005 - 1.030 Final  . pH 08/16/2016 6.0  5.0 - 8.0 Final  . Glucose, UA 08/16/2016 NEGATIVE  NEGATIVE mg/dL Final  . Hgb urine dipstick 08/16/2016 SMALL* NEGATIVE Final  . Bilirubin Urine 08/16/2016 NEGATIVE  NEGATIVE Final  . Ketones, ur 08/16/2016 NEGATIVE  NEGATIVE mg/dL Final  . Protein, ur 08/16/2016 NEGATIVE  NEGATIVE mg/dL Final  . Nitrite 08/16/2016 NEGATIVE  NEGATIVE Final  . Leukocytes, UA 08/16/2016 MODERATE* NEGATIVE Final  . Squamous Epithelial / LPF 08/16/2016 0-5* NONE SEEN Final  .  WBC, UA 08/16/2016 6-30  0 - 5 WBC/hpf Final  . RBC / HPF 08/16/2016 0-5  0 - 5 RBC/hpf Final  . Bacteria, UA 08/16/2016 MANY* NONE SEEN Final  . Urine-Other 08/16/2016 MUCOUS PRESENT   Final     X-Rays:No results found.  EKG:No orders found for this or any previous visit.   Hospital Course: Kristin Mckinney is a 60 y.o. who was admitted to [facility]. They were brought to the operating  room on 08/23/2016 and underwent Procedure(s): LEFT TOTAL KNEE ARTHROPLASTY RIGHT KNEE CORTISONE INJECTION.  Patient tolerated the procedure well and was later transferred to the recovery room and then to the orthopaedic floor for postoperative care.  They were given PO and IV analgesics for pain control following their surgery.  They were given 24 hours of postoperative antibiotics of  Anti-infectives    Start     Dose/Rate Route Frequency Ordered Stop   08/23/16 1630  ceFAZolin (ANCEF) IVPB 2g/100 mL premix     2 g 200 mL/hr over 30 Minutes Intravenous Every 6 hours 08/23/16 1350 08/23/16 2309   08/23/16 0812  ceFAZolin (ANCEF) IVPB 2g/100 mL premix     2 g 200 mL/hr over 30 Minutes Intravenous On call to O.R. 08/23/16 1610 08/23/16 1028     and started on DVT prophylaxis in the form of Xarelto.   PT and OT were ordered for total joint protocol.  Discharge planning consulted to help with postop disposition and equipment needs.  Patient had a tough night on the evening of surgery.  They started to get up OOB with therapy on day one. Hemovac drain was pulled without difficulty.  Continued to work with therapy into day two.  Dressing was changed on day two and the incision was healing well.   Patient was seen in rounds and was ready to go home on day two.   Discharge home with home health Diet - Regular diet Follow up - in 2 weeks Activity - WBAT Disposition - Home Condition Upon Discharge - Good D/C Meds - See DC Summary DVT Prophylaxis - Xarelto   Discharge Instructions    Call MD / Call  911    Complete by:  As directed    If you experience chest pain or shortness of breath, CALL 911 and be transported to the hospital emergency room.  If you develope a fever above 101 F, pus (white drainage) or increased drainage or redness at the wound, or calf pain, call your surgeon's office.   Change dressing    Complete by:  As directed    Change dressing daily with sterile 4 x 4 inch gauze dressing and apply TED hose. Do not submerge the incision under water.   Constipation Prevention    Complete by:  As directed    Drink plenty of fluids.  Prune juice may be helpful.  You may use a stool softener, such as Colace (over the counter) 100 mg twice a day.  Use MiraLax (over the counter) for constipation as needed.   Diet general    Complete by:  As directed    Discharge instructions    Complete by:  As directed    Pick up stool softner and laxative for home use following surgery while on pain medications. Do not submerge incision under water. Please use good hand washing techniques while changing dressing each day. May shower starting three days after surgery. Please use a clean towel to pat the incision dry following showers. Continue to use ice for pain and swelling after surgery. Do not use any lotions or creams on the incision until instructed by your surgeon.   Postoperative Constipation Protocol  Constipation - defined medically as fewer than three stools per week and severe constipation as less than one stool per week.  One of the most common issues patients have following surgery is constipation.  Even if you have a regular bowel pattern at home, your normal regimen is likely to  be disrupted due to multiple reasons following surgery.  Combination of anesthesia, postoperative narcotics, change in appetite and fluid intake all can affect your bowels.  In order to avoid complications following surgery, here are some recommendations in order to help you during your recovery  period.  Colace (docusate) - Pick up an over-the-counter form of Colace or another stool softener and take twice a day as long as you are requiring postoperative pain medications.  Take with a full glass of water daily.  If you experience loose stools or diarrhea, hold the colace until you stool forms back up.  If your symptoms do not get better within 1 week or if they get worse, check with your doctor.  Dulcolax (bisacodyl) - Pick up over-the-counter and take as directed by the product packaging as needed to assist with the movement of your bowels.  Take with a full glass of water.  Use this product as needed if not relieved by Colace only.   MiraLax (polyethylene glycol) - Pick up over-the-counter to have on hand.  MiraLax is a solution that will increase the amount of water in your bowels to assist with bowel movements.  Take as directed and can mix with a glass of water, juice, soda, coffee, or tea.  Take if you go more than two days without a movement. Do not use MiraLax more than once per day. Call your doctor if you are still constipated or irregular after using this medication for 7 days in a row.  If you continue to have problems with postoperative constipation, please contact the office for further assistance and recommendations.  If you experience "the worst abdominal pain ever" or develop nausea or vomiting, please contact the office immediatly for further recommendations for treatment.   Take Xarelto for two and a half more weeks, then discontinue Xarelto. Once the patient has completed the blood thinner regimen, then take a Baby 81 mg Aspirin daily for three more weeks.   Do not put a pillow under the knee. Place it under the heel.    Complete by:  As directed    Do not sit on low chairs, stoools or toilet seats, as it may be difficult to get up from low surfaces    Complete by:  As directed    Driving restrictions    Complete by:  As directed    No driving until released by the  physician.   Increase activity slowly as tolerated    Complete by:  As directed    Lifting restrictions    Complete by:  As directed    No lifting until released by the physician.   Patient may shower    Complete by:  As directed    You may shower without a dressing once there is no drainage.  Do not wash over the wound.  If drainage remains, do not shower until drainage stops.   TED hose    Complete by:  As directed    Use stockings (TED hose) for 3 weeks on both leg(s).  You may remove them at night for sleeping.   Weight bearing as tolerated    Complete by:  As directed    Laterality:  left   Extremity:  Lower       Medication List    STOP taking these medications   calcium citrate-vitamin D 315-200 MG-UNIT tablet Commonly known as:  CITRACAL+D   multivitamin with minerals Tabs tablet     TAKE these medications  cyclobenzaprine 10 MG tablet Commonly known as:  FLEXERIL Take 1 tablet (10 mg total) by mouth 3 (three) times daily as needed for muscle spasms.   FLUTTER Devi Use as directed   HYDROmorphone 2 MG tablet Commonly known as:  DILAUDID Take 1-2 tablets (2-4 mg total) by mouth every 4 (four) hours as needed for moderate pain or severe pain.   rivaroxaban 10 MG Tabs tablet Commonly known as:  XARELTO Take 1 tablet (10 mg total) by mouth daily with breakfast. Take Xarelto for two and a half more weeks, then discontinue Xarelto. Once the patient has completed the blood thinner regimen, then take a Baby 81 mg Aspirin daily for three more weeks. Start taking on:  08/25/2016   traMADol 50 MG tablet Commonly known as:  ULTRAM Take 1-2 tablets (50-100 mg total) by mouth every 6 (six) hours as needed (mild pain). What changed:  reasons to take this      Follow-up Information    Gearlean Alf, MD. Schedule an appointment as soon as possible for a visit on 09/07/2016.   Specialty:  Orthopedic Surgery Contact information: [address] [place] [address] [phone]           Signed: Arlee Muslim, PA-C Orthopaedic Surgery 08/24/2016, 9:06 AM

## 2016-08-24 NOTE — Progress Notes (Signed)
Physical Therapy Treatment Patient Details Name: Kristin JobsJayne P Mckinney MRN: [id number] DOB: 01/31/1956 Today's Date: 08/24/2016    History of Present Illness Pt s/p L TKR     PT Comments    Pt progressing well with mobility and hopeful for dc tomorrow.  Follow Up Recommendations  Home health PT     Equipment Recommendations  None recommended by PT    Recommendations for Other Services OT consult     Precautions / Restrictions Precautions Precautions: Knee;Fall Required Braces or Orthoses: Knee Immobilizer - Left Knee Immobilizer - Left: Discontinue once straight leg raise with < 10 degree lag Restrictions Weight Bearing Restrictions: No Other Position/Activity Restrictions: WBAT    Mobility  Bed Mobility Overal bed mobility: Needs Assistance Bed Mobility: Sit to Supine       Sit to supine: Min guard   General bed mobility comments: cues for sequence and use of R LE to self assist  Transfers Overall transfer level: Needs assistance Equipment used: Rolling walker (2 wheeled) Transfers: Sit to/from Stand Sit to Stand: Min guard         General transfer comment: min cues for LE management and use of UEs to self assist  Ambulation/Gait Ambulation/Gait assistance: Min guard Ambulation Distance (Feet): 130 Feet Assistive device: Rolling walker (2 wheeled) Gait Pattern/deviations: Step-to pattern;Shuffle;Trunk flexed Gait velocity: decr Gait velocity interpretation: Below normal speed for age/gender General Gait Details: min cues for sequence, posture and position from [employer]   Stairs            Wheelchair Mobility    Modified Rankin (Stroke Patients Only)       Balance                                    Cognition Arousal/Alertness: Awake/alert Behavior During Therapy: WFL for tasks assessed/performed Overall Cognitive Status: Within Functional Limits for tasks assessed                      Exercises Total Joint  Exercises Ankle Circles/Pumps: AROM;Both;15 reps;Supine Quad Sets: AROM;Both;10 reps;Supine Heel Slides: AAROM;Left;Supine;10 reps Straight Leg Raises: AAROM;AROM;Left;Supine;10 reps    General Comments        Pertinent Vitals/Pain Pain Assessment: 0-10 Pain Score: 5  Pain Location: L knee Pain Descriptors / Indicators: Aching;Sore Pain Intervention(s): Limited activity within patient's tolerance;Monitored during session;Premedicated before session;Ice applied    Home Living                      Prior Function            PT Goals (current goals can now be found in the care plan section) Acute Rehab PT Goals Patient Stated Goal: Regain IND PT Goal Formulation: With patient Time For Goal Achievement: 08/27/16 Potential to Achieve Goals: Good Progress towards PT goals: Progressing toward goals    Frequency    7X/week      PT Plan Current plan remains appropriate    Co-evaluation             End of Session Equipment Utilized During Treatment: Gait belt Activity Tolerance: Patient tolerated treatment well Patient left: in bed;with call bell/phone within reach     Time: 1340-1425 PT Time Calculation (min) (ACUTE ONLY): 45 min  Charges:  $Gait Training: 23-37 mins $Therapeutic Exercise: 8-22 mins  G Codes:      Kristin Mckinney 08/24/2016, 3:37 PM

## 2016-08-24 NOTE — Consult Note (Signed)
   [facility]   08/24/2016  Kristin JobsJayne P Mckinney September 16, 1956 [id number]    Came to visit [name] on behalf of [facility]. Discussed [facility]ness program. Denies any Link to Wellness needs at this time. Link to [employer] packet, 24-hr nurse line, and contact information. Confirmed best contact number as [phone]. Discussed post hospital discharge call. Appreciative of visit.    Raiford NobleAtika Hall, MSN-Ed, RN,BSN [facility] Hospital Liaison [phone]

## 2016-08-25 LAB — CBC
HCT: 30.5 % — ABNORMAL LOW (ref 36.0–46.0)
Hemoglobin: 10.2 g/dL — ABNORMAL LOW (ref 12.0–15.0)
MCH: 29.1 pg (ref 26.0–34.0)
MCHC: 33.4 g/dL (ref 30.0–36.0)
MCV: 87.1 fL (ref 78.0–100.0)
PLATELETS: 218 10*3/uL (ref 150–400)
RBC: 3.5 MIL/uL — AB (ref 3.87–5.11)
RDW: 14.2 % (ref 11.5–15.5)
WBC: 17.7 10*3/uL — ABNORMAL HIGH (ref 4.0–10.5)

## 2016-08-25 LAB — BASIC METABOLIC PANEL
Anion gap: 6 (ref 5–15)
BUN: 15 mg/dL (ref 6–20)
CALCIUM: 9 mg/dL (ref 8.9–10.3)
CO2: 27 mmol/L (ref 22–32)
CREATININE: 0.58 mg/dL (ref 0.44–1.00)
Chloride: 105 mmol/L (ref 101–111)
GFR calc Af Amer: >60 mL/min (ref >60)
GLUCOSE: 132 mg/dL — AB (ref 65–99)
POTASSIUM: 3.8 mmol/L (ref 3.5–5.1)
SODIUM: 138 mmol/L (ref 135–145)

## 2016-08-25 MED FILL — CYCLOBENZAPRINE 10 MG TAB: 10 | 30 days supply | Qty: 90 | Fill #0

## 2016-08-25 MED FILL — XARELTO 10 MG TABLET: 10 | 20 days supply | Qty: 20 | Fill #0

## 2016-08-25 MED FILL — traMADol HCL 50 MG TABS: 50 | 10 days supply | Qty: 80 | Fill #0

## 2016-08-25 MED FILL — HYDROmorphone HCL 2 MG TABS: 2 | 6 days supply | Qty: 80 | Fill #0

## 2016-08-25 NOTE — Progress Notes (Signed)
Physical Therapy Treatment Patient Details Name: Kristin JobsJayne P Lantz MRN: [id number] DOB: Jun 21, 1956 Today's Date: 08/25/2016    History of Present Illness Pt s/p L TKR     PT Comments    Pt ltd this am by increased pain  Follow Up Recommendations  Home health PT     Equipment Recommendations  None recommended by PT    Recommendations for Other Services OT consult     Precautions / Restrictions Precautions Precautions: Knee;Fall Required Braces or Orthoses: Knee Immobilizer - Left Knee Immobilizer - Left: Discontinue once straight leg raise with < 10 degree lag Restrictions Weight Bearing Restrictions: No Other Position/Activity Restrictions: WBAT    Mobility  Bed Mobility Overal bed mobility: Needs Assistance Bed Mobility: Supine to Sit     Supine to sit: Min guard     General bed mobility comments: cues for sequence and use of R LE to self assist  Transfers Overall transfer level: Needs assistance Equipment used: Rolling walker (2 wheeled) Transfers: Sit to/from Stand Sit to Stand: Min guard         General transfer comment: min cues for LE management and use of UEs to self assist  Ambulation/Gait Ambulation/Gait assistance: Min guard Ambulation Distance (Feet): 70 Feet Assistive device: Rolling walker (2 wheeled) Gait Pattern/deviations: Step-to pattern;Decreased step length - right;Decreased step length - left;Shuffle;Trunk flexed Gait velocity: decr Gait velocity interpretation: Below normal speed for age/gender General Gait Details: min cues for sequence, posture and position from RW   Stairs Stairs: Yes Stairs assistance: Min assist Stair Management: One rail Left;Step to pattern;Forwards;With cane Number of Stairs: 5 General stair comments: cues for sequence and foot/QC placement  Wheelchair Mobility    Modified Rankin (Stroke Patients Only)       Balance                                    Cognition Arousal/Alertness:  Awake/alert Behavior During Therapy: WFL for tasks assessed/performed Overall Cognitive Status: Within Functional Limits for tasks assessed                      Exercises Total Joint Exercises Ankle Circles/Pumps: AROM;Both;15 reps;Supine Quad Sets: AROM;Both;10 reps;Supine Heel Slides: AAROM;Left;Supine;10 reps Straight Leg Raises: AAROM;AROM;Left;Supine;10 reps Goniometric ROM: AAROM L knee -8 - 45    General Comments        Pertinent Vitals/Pain Pain Assessment: 0-10 Pain Score: 7  Pain Location: L knee Pain Descriptors / Indicators: Aching;Sore Pain Intervention(s): Limited activity within patient's tolerance;Monitored during session;Premedicated before session;Ice applied    Home Living                      Prior Function            PT Goals (current goals can now be found in the care plan section) Acute Rehab PT Goals Patient Stated Goal: Regain IND PT Goal Formulation: With patient Time For Goal Achievement: 08/27/16 Potential to Achieve Goals: Good Progress towards PT goals: Progressing toward goals    Frequency    7X/week      PT Plan Current plan remains appropriate    Co-evaluation             End of Session Equipment Utilized During Treatment: Gait belt;Left knee immobilizer Activity Tolerance: Patient tolerated treatment well Patient left: with call bell/phone within reach;in chair     Time: [id number] PT  Time Calculation (min) (ACUTE ONLY): 43 min  Charges:  $Gait Training: 23-37 mins $Therapeutic Exercise: 8-22 mins                    G Codes:      Jakita Dutkiewicz 08/25/2016, 1:22 PM

## 2016-08-25 NOTE — Progress Notes (Signed)
   Subjective: 2 Days Post-Op Procedure(s) (LRB): LEFT TOTAL KNEE ARTHROPLASTY (Left) RIGHT KNEE CORTISONE INJECTION (Right) Patient reports pain as mild.   Patient seen in rounds for Dr. Lequita HaltAluisio. Patient is well, but has had some minor complaints of pain in the knee, requiring pain medications Patient is ready to go home  Objective: Vital signs in last 24 hours: Temp:  [98.2 F (36.8 C)-98.7 F (37.1 C)] 98.4 F (36.9 C) (11/15 0633) Pulse Rate:  [71-78] 75 (11/15 0633) Resp:  [16-18] 18 (11/15 0633) BP: (102-118)/(55-67) 118/67 (11/15 0633) SpO2:  [94 %-99 %] 94 % (11/15 16100633)  Intake/Output from previous day:  Intake/Output Summary (Last 24 hours) at 08/25/16 0725 Last data filed at 08/25/16 0634  Gross per 24 hour  Intake             1560 ml  Output              700 ml  Net              860 ml    Intake/Output this shift: No intake/output data recorded.  Labs:  Recent Labs  08/24/16 0359 08/25/16 0414  HGB 10.8* 10.2*    Recent Labs  08/24/16 0359 08/25/16 0414  WBC 16.3* 17.7*  RBC 3.77* 3.50*  HCT 32.8* 30.5*  PLT 200 218    Recent Labs  08/24/16 0359 08/25/16 0414  NA 140 138  K 4.0 3.8  CL 109 105  CO2 24 27  BUN 13 15  CREATININE 0.59 0.58  GLUCOSE 178* 132*  CALCIUM 8.9 9.0   No results for input(s): LABPT, INR in the last 72 hours.  EXAM: General - Patient is Alert, Appropriate and Oriented Extremity - Neurovascular intact Sensation intact distally Intact pulses distally Dorsiflexion/Plantar flexion intact Incision - clean, dry, no drainage Motor Function - intact, moving foot and toes well on exam.   Assessment/Plan: 2 Days Post-Op Procedure(s) (LRB): LEFT TOTAL KNEE ARTHROPLASTY (Left) RIGHT KNEE CORTISONE INJECTION (Right) Procedure(s) (LRB): LEFT TOTAL KNEE ARTHROPLASTY (Left) RIGHT KNEE CORTISONE INJECTION (Right) Past Medical History:  Diagnosis Date  . Arthritis    osteoarthritis -knees  . Pneumonia    Jan 2017-  no residual problems   Principal Problem:   OA (osteoarthritis) of knee  Estimated body mass index is 26.95 kg/m as calculated from the following:   Height as of this encounter: 5\' 6"  (1.676 m).   Weight as of this encounter: 75.8 kg (167 lb). Up with therapy Discharge home with home health Diet - Regular diet Follow up - in 2 weeks Activity - WBAT Disposition - Home Condition Upon Discharge - Good D/C Meds - See DC Summary DVT Prophylaxis - Xarelto  Avel Peacerew Perkins, PA-C Orthopaedic Surgery 08/25/2016, 7:25 AM

## 2016-08-25 NOTE — Progress Notes (Signed)
Physical Therapy Treatment Patient Details Name: Kristin Mckinney MRN: [id number] DOB: 1955-12-22 Today's Date: 08/25/2016    History of Present Illness Pt s/p L TKR     PT Comments    Pt motivated and progressing steadily.  Reviewed therex, stairs and don/doff KI with pt and family.  Follow Up Recommendations  Home health PT     Equipment Recommendations  None recommended by PT    Recommendations for Other Services OT consult     Precautions / Restrictions Precautions Precautions: Knee;Fall Required Braces or Orthoses: Knee Immobilizer - Left Knee Immobilizer - Left: Discontinue once straight leg raise with < 10 degree lag Restrictions Weight Bearing Restrictions: No Other Position/Activity Restrictions: WBAT    Mobility  Bed Mobility                  Transfers Overall transfer level: Needs assistance Equipment used: Rolling walker (2 wheeled) Transfers: Sit to/from Stand Sit to Stand: Supervision         General transfer comment: min cues for LE management and use of UEs to self assist  Ambulation/Gait Ambulation/Gait assistance: Min guard;Supervision Ambulation Distance (Feet): 50 Feet Assistive device: Rolling walker (2 wheeled) Gait Pattern/deviations: Step-to pattern;Decreased step length - left;Decreased step length - right;Shuffle;Trunk flexed Gait velocity: decr Gait velocity interpretation: Below normal speed for age/gender General Gait Details: min cues for sequence, posture and position from [employer]   Stairs   Stairs assistance: Min assist Stair Management: One rail Left;Step to pattern;Forwards;With cane Number of Stairs: 2 General stair comments: cues for sequence and foot/QC placement  Wheelchair Mobility    Modified Rankin (Stroke Patients Only)       Balance                                    Cognition Arousal/Alertness: Awake/alert Behavior During Therapy: WFL for tasks assessed/performed Overall Cognitive  Status: Within Functional Limits for tasks assessed                      Exercises Total Joint Exercises Ankle Circles/Pumps: AROM;Both;15 reps;Supine Quad Sets: AROM;Both;10 reps;Supine Heel Slides: AAROM;Left;Supine;10 reps Straight Leg Raises: AAROM;AROM;Left;Supine;10 reps    General Comments        Pertinent Vitals/Pain Pain Assessment: 0-10 Pain Score: 7  Pain Location: L knee Pain Descriptors / Indicators: Aching;Sore Pain Intervention(s): Limited activity within patient's tolerance;Monitored during session;Premedicated before session    Home Living                      Prior Function            PT Goals (current goals can now be found in the care plan section) Acute Rehab PT Goals Patient Stated Goal: Regain IND PT Goal Formulation: With patient Time For Goal Achievement: 08/27/16 Potential to Achieve Goals: Good Progress towards PT goals: Progressing toward goals    Frequency    7X/week      PT Plan Current plan remains appropriate    Co-evaluation             End of Session Equipment Utilized During Treatment: Gait belt;Left knee immobilizer Activity Tolerance: Patient tolerated treatment well Patient left: with call bell/phone within reach;in chair     Time: 1405-1430 PT Time Calculation (min) (ACUTE ONLY): 25 min  Charges:  $Gait Training: 8-22 mins $Therapeutic Exercise: 8-22 mins  G Codes:      Christobal Morado 08/25/2016, 3:56 PM

## 2016-08-26 DIAGNOSIS — Z96652 Presence of left artificial knee joint: Secondary | ICD-10-CM | POA: Diagnosis not present

## 2016-08-26 DIAGNOSIS — Z7901 Long term (current) use of anticoagulants: Secondary | ICD-10-CM | POA: Diagnosis not present

## 2016-08-26 DIAGNOSIS — Z471 Aftercare following joint replacement surgery: Secondary | ICD-10-CM | POA: Diagnosis not present

## 2016-08-28 DIAGNOSIS — Z7901 Long term (current) use of anticoagulants: Secondary | ICD-10-CM | POA: Diagnosis not present

## 2016-08-28 DIAGNOSIS — Z96652 Presence of left artificial knee joint: Secondary | ICD-10-CM | POA: Diagnosis not present

## 2016-08-28 DIAGNOSIS — Z471 Aftercare following joint replacement surgery: Secondary | ICD-10-CM | POA: Diagnosis not present

## 2016-08-30 DIAGNOSIS — Z471 Aftercare following joint replacement surgery: Secondary | ICD-10-CM | POA: Diagnosis not present

## 2016-08-30 DIAGNOSIS — Z96652 Presence of left artificial knee joint: Secondary | ICD-10-CM | POA: Diagnosis not present

## 2016-08-30 DIAGNOSIS — Z7901 Long term (current) use of anticoagulants: Secondary | ICD-10-CM | POA: Diagnosis not present

## 2016-09-01 DIAGNOSIS — Z96652 Presence of left artificial knee joint: Secondary | ICD-10-CM | POA: Diagnosis not present

## 2016-09-01 DIAGNOSIS — Z7901 Long term (current) use of anticoagulants: Secondary | ICD-10-CM | POA: Diagnosis not present

## 2016-09-01 DIAGNOSIS — Z471 Aftercare following joint replacement surgery: Secondary | ICD-10-CM | POA: Diagnosis not present

## 2016-09-04 DIAGNOSIS — Z96652 Presence of left artificial knee joint: Secondary | ICD-10-CM | POA: Diagnosis not present

## 2016-09-04 DIAGNOSIS — Z471 Aftercare following joint replacement surgery: Secondary | ICD-10-CM | POA: Diagnosis not present

## 2016-09-04 DIAGNOSIS — Z7901 Long term (current) use of anticoagulants: Secondary | ICD-10-CM | POA: Diagnosis not present

## 2016-09-08 ENCOUNTER — Ambulatory Visit: Payer: 59 | Attending: Orthopedic Surgery | Admitting: Physical Therapy

## 2016-09-08 ENCOUNTER — Encounter: Payer: Self-pay | Admitting: Physical Therapy

## 2016-09-08 DIAGNOSIS — M25662 Stiffness of left knee, not elsewhere classified: Secondary | ICD-10-CM | POA: Diagnosis not present

## 2016-09-08 DIAGNOSIS — M25562 Pain in left knee: Secondary | ICD-10-CM | POA: Diagnosis not present

## 2016-09-08 DIAGNOSIS — R262 Difficulty in walking, not elsewhere classified: Secondary | ICD-10-CM | POA: Diagnosis not present

## 2016-09-08 DIAGNOSIS — R2242 Localized swelling, mass and lump, left lower limb: Secondary | ICD-10-CM | POA: Diagnosis not present

## 2016-09-08 NOTE — Therapy (Signed)
[facility] [address] [place], [place], [address] Phone: [phone]   Fax:  [phone]  Physical Therapy Evaluation  Patient Details  Name: Kristin JobsJayne P Ortega MRN: [id number] Date of Birth: 01-Oct-1956 Referring Provider: Dr. Despina HickAlusio  Encounter Date: 09/08/2016      PT End of Session - 09/08/16 1432    Visit Number 1   Number of Visits 24   Date for PT Re-Evaluation 11/03/16   PT Start Time 0200   PT Stop Time 0245   PT Time Calculation (min) 45 min   Activity Tolerance Patient tolerated treatment well   Behavior [facility] Therapy [facility] for tasks assessed/performed      Past Medical History:  Diagnosis Date  . Arthritis    osteoarthritis -knees  . Pneumonia    Jan 2017- no residual problems    Past Surgical History:  Procedure Laterality Date  . CERVICAL DISC SURGERY     10 years ago- anterior  . INJECTION KNEE Right 08/23/2016   Procedure: RIGHT KNEE CORTISONE INJECTION;  Surgeon: Ollen GrossFrank Aluisio, MD;  Location: [facility];  Service: Orthopedics;  Laterality: Right;  . KNEE SURGERY Right    "ACL reapir -Dr. Thomasena Ediscollins- 5 yrs ago   . TONSILLECTOMY    . TOTAL KNEE ARTHROPLASTY Left 08/23/2016   Procedure: LEFT TOTAL KNEE ARTHROPLASTY;  Surgeon: Ollen GrossFrank Aluisio, MD;  Location: [facility];  Service: Orthopedics;  Laterality: Left;    There were no vitals filed for this visit.       Subjective Assessment - 09/08/16 1356    Subjective Pt underwent TKA surgery for the left knee on 08/23/16 and also received cortisone injection in the right knee. Pt states she has not had any setbacks in the past two weeks and received home health ohysical therapy 2-3 days/week for the past 2 weeks. She reports they mainly worked on AROM and PROM. She transitioned to a cane in the past week and is still ambulating with a 4 point cane. She states that she "likes the security" of having the cane especially when she walks her dog.    Limitations  Walking   How long can you stand comfortably? havent tried yet   How long can you walk comfortably? 30 minutes   Diagnostic tests Xray on December 19   Patient Stated Goals decreasing swelling, increase ROM, regain confidence with walking   Currently in Pain? Yes   Pain Score 2    Pain Location Knee   Pain Orientation Posterior   Pain Descriptors / Indicators Aching   Pain Type Surgical pain   Pain Onset 1 to 4 weeks ago   Pain Frequency Constant   Aggravating Factors  Stanidng    Pain Relieving Factors Pain pills (Tramadol)            [facility] PT Assessment - 09/08/16 0001      Assessment   Medical Diagnosis TKA   Referring Provider Dr. Despina HickAlusio   Next MD Visit 09/28/16   Prior Therapy yes  home health     Precautions   Precautions None     Restrictions   Weight Bearing Restrictions No     Balance Screen   Has the patient fallen in the past 6 months No     Home Environment   Living Environment Private residence   Home Access Stairs to enter   Entrance Stairs-Number of Steps 6-7   Entrance Stairs-Rails Left;Right;Cannot reach both     Prior  Function   Level of Independence Independent   Vocation Full time employment  currently not working   [employer]ocation Requirements walking, pushing, pulling, transfers  Nurse   Leisure Walking, hiking, swimming, outdoors activities     Observation/Other Assessments-Edema    Edema Circumferential     Circumferential Edema   Circumferential - Right 42 cm, 36 cm  Above/below patella   Circumferential - Left  48 cm, 42 cm  above/below patella     Sensation   Additional Comments slight numbness around scar     ROM / Strength   AROM / PROM / Strength AROM;PROM     AROM   Overall AROM  Deficits   AROM Assessment Site Knee   Right/Left Knee Right;Left   Left Knee Extension 5  supine   Left Knee Flexion 85  supine posiiton. Sitting (95 degrees)     PROM   PROM Assessment Site Knee   Right/Left Knee Left   Left Knee  Extension 2                   [facility] Adult PT Treatment/Exercise - 09/08/16 0001      Modalities   Modalities Electrical Stimulation;Vasopneumatic     Electrical Stimulation   Electrical Stimulation Location around knee joint   Electrical Stimulation Action IFC   Electrical Stimulation Goals Pain;Edema     Vasopneumatic   Number Minutes Vasopneumatic  15 minutes   Vasopnuematic Location  Knee   Vasopneumatic Pressure Medium   Vasopneumatic Temperature  32                PT Education - 09/08/16 1431    Education provided No  Pt already received HEP form home health          PT Short Term Goals - 09/08/16 1449      PT SHORT TERM GOAL #1   Title Pt will demonstrate proper recall of current HEP.   Time 1   Period Weeks   Status New           PT Long Term Goals - 09/08/16 1449      PT LONG TERM GOAL #1   Title Pt will decrease swelling in left knee measurements to mathc the right knee in order to help normalize gait pattern.   Time 8   Period Weeks   Status New     PT LONG TERM GOAL #2   Title Pt will increase AROM flexion to 120 degrees and AROM extension to neutral in order to help normalize gait and functional movements.   Time 8   Period Weeks   Status New     PT LONG TERM GOAL #3   Title Pt will be able to ambulate 1000 feet in 6 minutes without AD and no stumbles in order to increase walking activity level.   Time 8   Period Weeks   Status New               Plan - 09/08/16 1433    Clinical Impression Statement Pt presents with knee pain due to TKA. Pt has decreased AROM on the left knee and also has increased swelling around the left knee joint. Pt is ambulating with 4 point cane due to fear of falling.  She has decreased stride length and decreased knee flexion with gait pattern and reports slgiht increase in pain during toe off.    Rehab Potential Good   PT Frequency 3x / week   PT  Duration 8 weeks   PT  Treatment/Interventions Cryotherapy;Electrical Stimulation;ADLs/Self Care Home Management;Moist Heat;Gait training;Stair training;Functional mobility training;Patient/family education;Neuromuscular re-education;Balance training;Therapeutic exercise;Therapeutic activities;Ultrasound;Manual techniques;Compression bandaging;Passive range of motion;Vasopneumatic Device;Energy conservation   PT Next Visit Plan pain management, Supine knee exercises, PROM, jt mobilization   PT Home Exercise Plan Continue with original HEP   Consulted and Agree with Plan of Care Patient      Patient will benefit from skilled therapeutic intervention in order to improve the following deficits and impairments:  Decreased balance, Decreased activity tolerance, Abnormal gait, Decreased coordination, Decreased endurance, Decreased mobility, Decreased strength, Decreased range of motion, Difficulty walking, Hypomobility, Increased edema, Impaired sensation, Impaired flexibility, Impaired tone, Improper body mechanics, Pain  Visit Diagnosis: Acute pain of left knee  Difficulty in walking, not elsewhere classified  Stiffness of left knee, not elsewhere classified     Problem List Patient Active Problem List   Diagnosis Date Noted  . OA (osteoarthritis) of knee 08/23/2016  . Lobar pneumonia (HCC) 10/21/2015  . Cough 10/20/2015    Tamsen Meek, SPT 09/08/2016, 2:54 PM  [facility] [address] [place], [place], [address] Phone: [phone]   Fax:  [phone]  Name: TERRIANNA HOLSCLAW MRN: [id number] Date of Birth: August 04, 1956

## 2016-09-13 ENCOUNTER — Ambulatory Visit: Payer: 59 | Attending: Orthopedic Surgery | Admitting: Physical Therapy

## 2016-09-13 ENCOUNTER — Encounter: Payer: Self-pay | Admitting: Physical Therapy

## 2016-09-13 DIAGNOSIS — M25662 Stiffness of left knee, not elsewhere classified: Secondary | ICD-10-CM | POA: Diagnosis not present

## 2016-09-13 DIAGNOSIS — R262 Difficulty in walking, not elsewhere classified: Secondary | ICD-10-CM | POA: Diagnosis not present

## 2016-09-13 DIAGNOSIS — M25562 Pain in left knee: Secondary | ICD-10-CM | POA: Diagnosis not present

## 2016-09-13 DIAGNOSIS — R2242 Localized swelling, mass and lump, left lower limb: Secondary | ICD-10-CM | POA: Diagnosis not present

## 2016-09-13 NOTE — Therapy (Signed)
[facility] [address] [place], [place], [address] Phone: [phone]   Fax:  [phone]  Physical Therapy Treatment  Patient Details  Name: Kristin Mckinney MRN: [id number] Date of Birth: 1956-06-24 Referring Provider: Dr. Despina HickAlusio  Encounter Date: 09/13/2016      PT End of Session - 09/13/16 0922    Visit Number 2   Number of Visits 24   Date for PT Re-Evaluation 11/03/16   PT Start Time 0841   PT Stop Time 0939   PT Time Calculation (min) 58 min   Activity Tolerance Patient tolerated treatment well   Behavior During Therapy [facility] for tasks assessed/performed      Past Medical History:  Diagnosis Date  . Arthritis    osteoarthritis -knees  . Pneumonia    Jan 2017- no residual problems    Past Surgical History:  Procedure Laterality Date  . CERVICAL DISC SURGERY     10 years ago- anterior  . INJECTION KNEE Right 08/23/2016   Procedure: RIGHT KNEE CORTISONE INJECTION;  Surgeon: Ollen GrossFrank Aluisio, MD;  Location: [facility];  Service: Orthopedics;  Laterality: Right;  . KNEE SURGERY Right    "ACL reapir -Dr. Thomasena Ediscollins- 5 yrs ago   . TONSILLECTOMY    . TOTAL KNEE ARTHROPLASTY Left 08/23/2016   Procedure: LEFT TOTAL KNEE ARTHROPLASTY;  Surgeon: Ollen GrossFrank Aluisio, MD;  Location: [facility];  Service: Orthopedics;  Laterality: Left;    There were no vitals filed for this visit.      Subjective Assessment - 09/13/16 0849    Subjective I am doing okay, just very stiff.   Currently in Pain? Yes   Pain Score 2    Pain Location Knee   Pain Orientation Left;Anterior   Pain Descriptors / Indicators Tightness                         [facility] Adult PT Treatment/Exercise - 09/13/16 0001      Exercises   Exercises Knee/Hip     Knee/Hip Exercises: Aerobic   Recumbent Bike 5 minutes full revolutions with hip hike   Nustep level 5 x 6 minutes     Knee/Hip Exercises: Machines for Strengthening   Cybex Knee  Extension 5# x10 and then left eccentrics x 10   Cybex Knee Flexion 25# 2x10   Cybex Leg Press 20# 2x10, then left only no weight 2x10     Vasopneumatic   Number Minutes Vasopneumatic  15 minutes   Vasopnuematic Location  Knee   Vasopneumatic Pressure Medium   Vasopneumatic Temperature  32     Manual Therapy   Manual Therapy Soft tissue mobilization;Passive ROM   Soft tissue mobilization scar and patellar mobs   Passive ROM to the knee flexion and extension                  PT Short Term Goals - 09/08/16 1449      PT SHORT TERM GOAL #1   Title Pt will demonstrate proper recall of current HEP.   Time 1   Period Weeks   Status New           PT Long Term Goals - 09/08/16 1449      PT LONG TERM GOAL #1   Title Pt will decrease swelling in left knee measurements to mathc the right knee in order to help normalize gait pattern.   Time 8   Period Weeks  Status New     PT LONG TERM GOAL #2   Title Pt will increase AROM flexion to 120 degrees and AROM extension to neutral in order to help normalize gait and functional movements.   Time 8   Period Weeks   Status New     PT LONG TERM GOAL #3   Title Pt will be able to ambulate 1000 feet in 6 minutes without AD and no stumbles in order to increase walking activity level.   Time 8   Period Weeks   Status New               Plan - 09/13/16 [id number]    Clinical Impression Statement Pateint doing very well overall, she reports "just stiff", has to hike hip to make full revolutions on teh bike.  Uses a WBQC for gait   PT Next Visit Plan pain management, Supine knee exercises, PROM, jt mobilization   Consulted and Agree with Plan of Care Patient      Patient will benefit from skilled therapeutic intervention in order to improve the following deficits and impairments:  Decreased balance, Decreased activity tolerance, Abnormal gait, Decreased coordination, Decreased endurance, Decreased mobility, Decreased strength,  Decreased range of motion, Difficulty walking, Hypomobility, Increased edema, Impaired sensation, Impaired flexibility, Impaired tone, Improper body mechanics, Pain  Visit Diagnosis: Acute pain of left knee  Difficulty in walking, not elsewhere classified  Stiffness of left knee, not elsewhere classified  Localized swelling, mass and lump, left lower limb     Problem List Patient Active Problem List   Diagnosis Date Noted  . OA (osteoarthritis) of knee 08/23/2016  . Lobar pneumonia (HCC) 10/21/2015  . Cough 10/20/2015    [name],Ladaija Dimino W., PT 09/13/2016, 9:24 AM  [facility] [address] [place], [place], [address] Phone: [phone]   Fax:  [phone]  Name: Kristin Mckinney MRN: [id number] Date of Birth: [birth year]

## 2016-09-15 ENCOUNTER — Ambulatory Visit: Payer: 59 | Admitting: Physical Therapy

## 2016-09-15 ENCOUNTER — Encounter: Payer: Self-pay | Admitting: Physical Therapy

## 2016-09-15 DIAGNOSIS — M25562 Pain in left knee: Secondary | ICD-10-CM

## 2016-09-15 DIAGNOSIS — R2242 Localized swelling, mass and lump, left lower limb: Secondary | ICD-10-CM | POA: Diagnosis not present

## 2016-09-15 DIAGNOSIS — R262 Difficulty in walking, not elsewhere classified: Secondary | ICD-10-CM

## 2016-09-15 DIAGNOSIS — M25662 Stiffness of left knee, not elsewhere classified: Secondary | ICD-10-CM

## 2016-09-15 NOTE — Therapy (Signed)
[facility] [address] [place], [place], [address] Phone: [phone]   Fax:  [phone]  Physical Therapy Treatment  Patient Details  Name: Kristin Mckinney MRN: [id number] Date of Birth: 1956-02-10 Referring Provider: Dr. Despina HickAlusio  Encounter Date: 09/15/2016      PT End of Session - 09/15/16 0934    Visit Number 3   Date for PT Re-Evaluation 11/03/16   PT Start Time 0845   PT Stop Time 0945   PT Time Calculation (min) 60 min   Activity Tolerance Patient tolerated treatment well;No increased pain   Behavior During Therapy WFL for tasks assessed/performed      Past Medical History:  Diagnosis Date  . Arthritis    osteoarthritis -knees  . Pneumonia    Jan 2017- no residual problems    Past Surgical History:  Procedure Laterality Date  . CERVICAL DISC SURGERY     10 years ago- anterior  . INJECTION KNEE Right 08/23/2016   Procedure: RIGHT KNEE CORTISONE INJECTION;  Surgeon: Ollen GrossFrank Aluisio, MD;  Location: [facility];  Service: Orthopedics;  Laterality: Right;  . KNEE SURGERY Right    "ACL reapir -Dr. Thomasena Ediscollins- 5 yrs ago   . TONSILLECTOMY    . TOTAL KNEE ARTHROPLASTY Left 08/23/2016   Procedure: LEFT TOTAL KNEE ARTHROPLASTY;  Surgeon: Ollen GrossFrank Aluisio, MD;  Location: [facility];  Service: Orthopedics;  Laterality: Left;    There were no vitals filed for this visit.      Subjective Assessment - 09/15/16 0844    Subjective Pt states she is doing good however she does feel stiff in the affected knee this morning.   Currently in Pain? Yes   Pain Score 2    Pain Location Knee                         [facility] Adult PT Treatment/Exercise - 09/15/16 0001      Knee/Hip Exercises: Stretches   Active Hamstring Stretch Left;3 reps;20 seconds   Gastroc Stretch 30 seconds;3 reps;Left  prostretch     Knee/Hip Exercises: Aerobic   Recumbent Bike 6 minutes   Nustep Nustep lvl 6, 5 minutes     Knee/Hip  Exercises: Standing   Heel Raises Both;2 sets;20 reps  up on toes, back on heels   Knee Flexion Strengthening;AROM;Left  standing HS curl   Forward Lunges Left;2 sets;10 reps;5 seconds     Knee/Hip Exercises: Seated   Stool Scoot - Round Trips down/back 3x     Modalities   Modalities Proofreaderlectrical Stimulation;Vasopneumatic     Electrical Stimulation   Electrical Stimulation Location knee jt   Statisticianlectrical Stimulation Action IFC   Electrical Stimulation Goals Pain;Edema     Vasopneumatic   Number Minutes Vasopneumatic  15 minutes   Vasopnuematic Location  Knee   Vasopneumatic Pressure Medium   Vasopneumatic Temperature  32     Manual Therapy   Manual Therapy Joint mobilization;Muscle Energy Technique   Joint Mobilization AP mobilzation   Soft tissue mobilization patella and scar mob   Muscle Energy Technique contract relax knee extension                PT Education - 09/15/16 0934    Education provided Yes   Education Details HEP: HS curl table slide, LAQ, calf stretch, HS stretch   Person(s) Educated Patient   Methods Handout;Explanation;Demonstration   Comprehension Verbalized understanding  PT Short Term Goals - 09/08/16 1449      PT SHORT TERM GOAL #1   Title Pt will demonstrate proper recall of current HEP.   Time 1   Period Weeks   Status New           PT Long Term Goals - 09/08/16 1449      PT LONG TERM GOAL #1   Title Pt will decrease swelling in left knee measurements to mathc the right knee in order to help normalize gait pattern.   Time 8   Period Weeks   Status New     PT LONG TERM GOAL #2   Title Pt will increase AROM flexion to 120 degrees and AROM extension to neutral in order to help normalize gait and functional movements.   Time 8   Period Weeks   Status New     PT LONG TERM GOAL #3   Title Pt will be able to ambulate 1000 feet in 6 minutes without AD and no stumbles in order to increase walking activity level.   Time  8   Period Weeks   Status New               Plan - 09/15/16 0935    Clinical Impression Statement Pt tolerated treatment well and was able to complete all exercises. Pt was stiff today and treatment focussed on knee mobility. Progress per pt tolerance.   Rehab Potential Good   PT Frequency 3x / week   PT Duration 8 weeks   PT Treatment/Interventions Cryotherapy;Electrical Stimulation;ADLs/Self Care Home Management;Moist Heat;Gait training;Stair training;Functional mobility training;Patient/family education;Neuromuscular re-education;Balance training;Therapeutic exercise;Therapeutic activities;Ultrasound;Manual techniques;Compression bandaging;Passive range of motion;Vasopneumatic Device;Energy conservation   PT Next Visit Plan pain management, Supine knee exercises, PROM, jt mobilization, knee machines   PT Home Exercise Plan Continue with original HEP   Consulted and Agree with Plan of Care Patient      Patient will benefit from skilled therapeutic intervention in order to improve the following deficits and impairments:  Decreased balance, Decreased activity tolerance, Abnormal gait, Decreased coordination, Decreased endurance, Decreased mobility, Decreased strength, Decreased range of motion, Difficulty walking, Hypomobility, Increased edema, Impaired sensation, Impaired flexibility, Impaired tone, Improper body mechanics, Pain  Visit Diagnosis: Acute pain of left knee  Difficulty in walking, not elsewhere classified  Stiffness of left knee, not elsewhere classified  Localized swelling, mass and lump, left lower limb     Problem List Patient Active Problem List   Diagnosis Date Noted  . OA (osteoarthritis) of knee 08/23/2016  . Lobar pneumonia (HCC) 10/21/2015  . Cough 10/20/2015    Tamsen MeekBrian Cade Cressie Betzler, SPT 09/15/2016, 9:37 AM  [facility] [address] [place], [place], [address] Phone: [phone]   Fax:   [phone]  Name: Kristin Mckinney MRN: [id number] Date of Birth: 03/15/1956

## 2016-09-16 MED FILL — HYDROmorphone HCL 2 MG TABS: 2 | 6 days supply | Qty: 40 | Fill #0

## 2016-09-17 ENCOUNTER — Encounter: Payer: Self-pay | Admitting: Physical Therapy

## 2016-09-17 ENCOUNTER — Ambulatory Visit: Payer: 59 | Admitting: Physical Therapy

## 2016-09-17 DIAGNOSIS — R2242 Localized swelling, mass and lump, left lower limb: Secondary | ICD-10-CM | POA: Diagnosis not present

## 2016-09-17 DIAGNOSIS — M25662 Stiffness of left knee, not elsewhere classified: Secondary | ICD-10-CM | POA: Diagnosis not present

## 2016-09-17 DIAGNOSIS — M25562 Pain in left knee: Secondary | ICD-10-CM | POA: Diagnosis not present

## 2016-09-17 DIAGNOSIS — R262 Difficulty in walking, not elsewhere classified: Secondary | ICD-10-CM | POA: Diagnosis not present

## 2016-09-17 NOTE — Therapy (Signed)
[facility] [address] [place], [place], [address] Phone: [phone]   Fax:  [phone]  Physical Therapy Treatment  Patient Details  Name: Kristin Mckinney MRN: [id number] Date of Birth: January 25, 1956 Referring Provider: Dr. Despina HickAlusio  Encounter Date: 09/17/2016      PT End of Session - 09/17/16 1017    Visit Number 4   Date for PT Re-Evaluation 11/03/16   PT Start Time 0926   PT Stop Time 1030   PT Time Calculation (min) 64 min   Activity Tolerance Patient tolerated treatment well   Behavior During Therapy [facility] for tasks assessed/performed      Past Medical History:  Diagnosis Date  . Arthritis    osteoarthritis -knees  . Pneumonia    Jan 2017- no residual problems    Past Surgical History:  Procedure Laterality Date  . CERVICAL DISC SURGERY     10 years ago- anterior  . INJECTION KNEE Right 08/23/2016   Procedure: RIGHT KNEE CORTISONE INJECTION;  Surgeon: Ollen GrossFrank Aluisio, MD;  Location: [facility];  Service: Orthopedics;  Laterality: Right;  . KNEE SURGERY Right    "ACL reapir -Dr. Thomasena Ediscollins- 5 yrs ago   . TONSILLECTOMY    . TOTAL KNEE ARTHROPLASTY Left 08/23/2016   Procedure: LEFT TOTAL KNEE ARTHROPLASTY;  Surgeon: Ollen GrossFrank Aluisio, MD;  Location: [facility];  Service: Orthopedics;  Laterality: Left;    There were no vitals filed for this visit.      Subjective Assessment - 09/17/16 0935    Subjective Reports she had a very good day yesterday.  Reports tried to sleep in bed but was not comfortable, tossed and turned all night   Currently in Pain? Yes   Pain Score 3    Pain Location Knee                         [facility] Adult PT Treatment/Exercise - 09/17/16 0001      High Level Balance   High Level Balance Comments resisted gait all directions, cues to bend the knee     Knee/Hip Exercises: Aerobic   Recumbent Bike 6 minutes   Nustep Nustep lvl 6, 6 minutes     Knee/Hip Exercises: Machines for  Strengthening   Cybex Knee Extension 5# x10 and then left eccentrics x 10   Cybex Knee Flexion 25# 2x10   Cybex Leg Press 20# 2x10, then left only no weight 2x10     Knee/Hip Exercises: Standing   Knee Flexion Strengthening;AROM;Left   Knee Flexion Limitations black tband     Vasopneumatic   Number Minutes Vasopneumatic  15 minutes   Vasopnuematic Location  Knee   Vasopneumatic Pressure Medium   Vasopneumatic Temperature  32     Manual Therapy   Manual Therapy Joint mobilization;Muscle Energy Technique   Joint Mobilization AP mobilzation   Soft tissue mobilization patella and scar mob   Passive ROM to the knee flexion and extension   Muscle Energy Technique contract relax knee extension                  PT Short Term Goals - 09/17/16 1019      PT SHORT TERM GOAL #1   Title Pt will demonstrate proper recall of current HEP.   Status Achieved           PT Long Term Goals - 09/08/16 1449      PT LONG TERM GOAL #1  Title Pt will decrease swelling in left knee measurements to mathc the right knee in order to help normalize gait pattern.   Time 8   Period Weeks   Status New     PT LONG TERM GOAL #2   Title Pt will increase AROM flexion to 120 degrees and AROM extension to neutral in order to help normalize gait and functional movements.   Time 8   Period Weeks   Status New     PT LONG TERM GOAL #3   Title Pt will be able to ambulate 1000 feet in 6 minutes without AD and no stumbles in order to increase walking activity level.   Time 8   Period Weeks   Status New               Plan - 09/17/16 1018    Clinical Impression Statement Patient with poor gait, stiff legged, denies pain, just reports does not want to bend it, needs a lot of cues to do better.  She is resistant to PROM into flexion   PT Next Visit Plan focus on gait and ROM   Consulted and Agree with Plan of Care Patient      Patient will benefit from skilled therapeutic intervention  in order to improve the following deficits and impairments:  Decreased balance, Decreased activity tolerance, Abnormal gait, Decreased coordination, Decreased endurance, Decreased mobility, Decreased strength, Decreased range of motion, Difficulty walking, Hypomobility, Increased edema, Impaired sensation, Impaired flexibility, Impaired tone, Improper body mechanics, Pain  Visit Diagnosis: Acute pain of left knee  Difficulty in walking, not elsewhere classified  Stiffness of left knee, not elsewhere classified  Localized swelling, mass and lump, left lower limb     Problem List Patient Active Problem List   Diagnosis Date Noted  . OA (osteoarthritis) of knee 08/23/2016  . Lobar pneumonia (HCC) 10/21/2015  . Cough 10/20/2015    [name],Kristopher Attwood W., PT 09/17/2016, 10:20 AM  [facility] [address] [place], [place], [address] Phone: [phone]   Fax:  [phone]  Name: Kristin Mckinney MRN: [id number] Date of Birth: 16-Jul-1956

## 2016-09-20 ENCOUNTER — Ambulatory Visit: Payer: 59 | Admitting: Physical Therapy

## 2016-09-20 ENCOUNTER — Encounter: Payer: Self-pay | Admitting: Physical Therapy

## 2016-09-20 DIAGNOSIS — R2242 Localized swelling, mass and lump, left lower limb: Secondary | ICD-10-CM | POA: Diagnosis not present

## 2016-09-20 DIAGNOSIS — R262 Difficulty in walking, not elsewhere classified: Secondary | ICD-10-CM

## 2016-09-20 DIAGNOSIS — M25562 Pain in left knee: Secondary | ICD-10-CM | POA: Diagnosis not present

## 2016-09-20 DIAGNOSIS — M25662 Stiffness of left knee, not elsewhere classified: Secondary | ICD-10-CM | POA: Diagnosis not present

## 2016-09-20 NOTE — Therapy (Signed)
[facility] [address] [place], [place], [address] Phone: [phone]   Fax:  [phone]  Physical Therapy Treatment  Patient Details  Name: Kristin JobsJayne P Wecker MRN: [id number] Date of Birth: October 24, 1955 Referring Provider: Dr. Despina HickAlusio  Encounter Date: 09/20/2016      PT End of Session - 09/20/16 1034    Visit Number 5   Number of Visits 24   Date for PT Re-Evaluation 11/03/16   PT Start Time 0923   PT Stop Time 1032   PT Time Calculation (min) 69 min   Activity Tolerance Patient tolerated treatment well   Behavior During Therapy [facility] for tasks assessed/performed      Past Medical History:  Diagnosis Date  . Arthritis    osteoarthritis -knees  . Pneumonia    Jan 2017- no residual problems    Past Surgical History:  Procedure Laterality Date  . CERVICAL DISC SURGERY     10 years ago- anterior  . INJECTION KNEE Right 08/23/2016   Procedure: RIGHT KNEE CORTISONE INJECTION;  Surgeon: Ollen GrossFrank Aluisio, MD;  [facility];  Service: Orthopedics;  Laterality: Right;  . KNEE SURGERY Right    "ACL reapir -Dr. Thomasena Ediscollins- 5 yrs ago   . TONSILLECTOMY    . TOTAL KNEE ARTHROPLASTY Left 08/23/2016   Procedure: LEFT TOTAL KNEE ARTHROPLASTY;  Surgeon: Ollen GrossFrank Aluisio, MD;  [facility];  Service: Orthopedics;  Laterality: Left;    There were no vitals filed for this visit.      Subjective Assessment - 09/20/16 0933    Subjective I really could not sleep last night, reports pain a 6-7/10 last night, reports she had to sit in a different position at a concert prior to that.   Currently in Pain? Yes   Pain Score 5    Pain Location Knee   Pain Orientation Left;Anterior   Pain Descriptors / Indicators Tightness            [facility] PT Assessment - 09/20/16 0001      AROM   Left Knee Flexion 93     PROM   Right/Left Knee Left   Left Knee Flexion 100                     [facility] Adult PT  Treatment/Exercise - 09/20/16 0001      Ambulation/Gait   Gait Comments gait over obstacle to get her to naturally flex the knee, needs cues.     Knee/Hip Exercises: Aerobic   Elliptical I=7 R=6 x 4 minutes   Nustep Nustep lvl 6, 6 minutes     Knee/Hip Exercises: Machines for Strengthening   Cybex Knee Extension 5# x10 and then left eccentrics x 10   Cybex Knee Flexion 25# 2x10   Cybex Leg Press 20# 2x10, then left only no weight 2x10     Knee/Hip Exercises: Standing   Step Down 5 sets;10 reps;Step Height: 4";Step Height: 6"   Other Standing Knee Exercises 40# left hip/knee press down with pulley system     Vasopneumatic   Number Minutes Vasopneumatic  15 minutes   Vasopnuematic Location  Knee   Vasopneumatic Pressure Medium   Vasopneumatic Temperature  32     Manual Therapy   Manual Therapy Joint mobilization;Muscle Energy Technique   Joint Mobilization AP mobilzation   Soft tissue mobilization patella and scar mob   Passive ROM to the knee flexion and extension   Muscle Energy Technique contract relax  knee extension                  PT Short Term Goals - 09/17/16 1019      PT SHORT TERM GOAL #1   Title Pt will demonstrate proper recall of current HEP.   Status Achieved           PT Long Term Goals - 09/08/16 1449      PT LONG TERM GOAL #1   Title Pt will decrease swelling in left knee measurements to mathc the right knee in order to help normalize gait pattern.   Time 8   Period Weeks   Status New     PT LONG TERM GOAL #2   Title Pt will increase AROM flexion to 120 degrees and AROM extension to neutral in order to help normalize gait and functional movements.   Time 8   Period Weeks   Status New     PT LONG TERM GOAL #3   Title Pt will be able to ambulate 1000 feet in 6 minutes without AD and no stumbles in order to increase walking activity level.   Time 8   Period Weeks   Status New               Plan - 09/20/16 1035    Clinical  Impression Statement Patient continues to need cues to bend knee with gait, she tends to be very stiff, she did show increased ROM today.   PT Next Visit Plan focus on gait and ROM   Consulted and Agree with Plan of Care Patient      Patient will benefit from skilled therapeutic intervention in order to improve the following deficits and impairments:  Decreased balance, Decreased activity tolerance, Abnormal gait, Decreased coordination, Decreased endurance, Decreased mobility, Decreased strength, Decreased range of motion, Difficulty walking, Hypomobility, Increased edema, Impaired sensation, Impaired flexibility, Impaired tone, Improper body mechanics, Pain  Visit Diagnosis: Acute pain of left knee  Difficulty in walking, not elsewhere classified  Stiffness of left knee, not elsewhere classified  Localized swelling, mass and lump, left lower limb     Problem List Patient Active Problem List   Diagnosis Date Noted  . OA (osteoarthritis) of knee 08/23/2016  . Lobar pneumonia (HCC) 10/21/2015  . Cough 10/20/2015    Kristin Mckinney,Kristin Bodiford W., PT 09/20/2016, 10:38 AM  [facility] [address] [place], [place], [address] Phone: [phone]   Fax:  [phone]  Name: Kristin JobsJayne P Newbern MRN: [id number] Date of Birth: 04-Mar-1956

## 2016-09-21 ENCOUNTER — Other Ambulatory Visit: Payer: Self-pay | Admitting: *Deleted

## 2016-09-21 NOTE — Patient Outreach (Addendum)
[facility] Care Management  09/21/2016  Kristin Mckinney 03-06-1956 [id number]   Subjective: Telephone call to patient's home / mobile number, no answer, left HIPAA compliant voicemail message, and requested call back.   Objective: Per chart review: Patient hospitalized 08/23/16 - 11/15/ 17 for OsteoarthritisBilateralknee(s).   Status post Left  Total Knee Arthroplasty and Right Knee cortisone injection on 08/23/16.     Assessment: Received UMR Transition of care referral on 08/24/16.   Transition of care follow up pending patient contact.       Plan: RNCM will call patient for 2nd telephone outreach attempt, transition of care follow up, within 10 business days, if no return call.   Roselynne Lortz H. Gardiner Barefootooper RN, BSN, CCM [facility] Care Management [facility] Telephonic CM Phone: [phone] Fax: [phone]

## 2016-09-22 ENCOUNTER — Encounter: Payer: Self-pay | Admitting: Physical Therapy

## 2016-09-22 ENCOUNTER — Ambulatory Visit: Payer: 59 | Admitting: Physical Therapy

## 2016-09-22 DIAGNOSIS — R2242 Localized swelling, mass and lump, left lower limb: Secondary | ICD-10-CM | POA: Diagnosis not present

## 2016-09-22 DIAGNOSIS — R262 Difficulty in walking, not elsewhere classified: Secondary | ICD-10-CM | POA: Diagnosis not present

## 2016-09-22 DIAGNOSIS — M25562 Pain in left knee: Secondary | ICD-10-CM | POA: Diagnosis not present

## 2016-09-22 DIAGNOSIS — M25662 Stiffness of left knee, not elsewhere classified: Secondary | ICD-10-CM

## 2016-09-22 NOTE — Therapy (Signed)
[facility] [address] [place], [place], [address] Phone: [phone]   Fax:  [phone]  Physical Therapy Treatment  Patient Details  Name: Kristin Mckinney MRN: [id number] Date of Birth: 1956-05-23 Referring Provider: Dr. Despina HickAlusio  Encounter Date: 09/22/2016      PT End of Session - 09/22/16 1013    Visit Number 6   Date for PT Re-Evaluation 11/03/16   PT Start Time 0930   PT Stop Time 1025   PT Time Calculation (min) 55 min   Activity Tolerance Patient tolerated treatment well   Behavior During Therapy [facility] for tasks assessed/performed      Past Medical History:  Diagnosis Date  . Arthritis    osteoarthritis -knees  . Pneumonia    Jan 2017- no residual problems    Past Surgical History:  Procedure Laterality Date  . CERVICAL DISC SURGERY     10 years ago- anterior  . INJECTION KNEE Right 08/23/2016   Procedure: RIGHT KNEE CORTISONE INJECTION;  Surgeon: Ollen GrossFrank Aluisio, MD;  [facility];  Service: Orthopedics;  Laterality: Right;  . KNEE SURGERY Right    "ACL reapir -Dr. Thomasena Ediscollins- 5 yrs ago   . TONSILLECTOMY    . TOTAL KNEE ARTHROPLASTY Left 08/23/2016   Procedure: LEFT TOTAL KNEE ARTHROPLASTY;  Surgeon: Ollen GrossFrank Aluisio, MD;  [facility];  Service: Orthopedics;  Laterality: Left;    There were no vitals filed for this visit.      Subjective Assessment - 09/22/16 0942    Subjective Pt states she is not doing as good this morning. She feels stiff and states it may be due to the way she slept last night or because of the cold weather.    Currently in Pain? Yes   Pain Score 4    Pain Location Knee   Pain Orientation Medial;Anterior;Left                         [facility] Adult PT Treatment/Exercise - 09/22/16 0001      Knee/Hip Exercises: Stretches   Gastroc Stretch 30 seconds;2 reps;Both     Knee/Hip Exercises: Aerobic   Elliptical 5 minutes, lvl 1   Nustep NuStep lvl 6, 8  minutes     Knee/Hip Exercises: Standing   Heel Raises 2 sets;20 reps;Both  on on toes, back on heels     Knee/Hip Exercises: Supine   Short Arc Quad Sets 2 sets;10 reps;Strengthening;Left   Straight Leg Raises 2 sets;10 reps;Strengthening;Left  2 lb ankle weight     Modalities   Modalities Vasopneumatic     Vasopneumatic   Number Minutes Vasopneumatic  15 minutes   Vasopnuematic Location  Knee   Vasopneumatic Pressure Medium   Vasopneumatic Temperature  32     Manual Therapy   Manual Therapy Joint mobilization;Passive ROM   Joint Mobilization AP tib on femur, AP femur on tib   Soft tissue mobilization patella mobilization   Passive ROM Hamstring stretch, SKC   Muscle Energy Technique contract relax to assist knee extension                PT Education - 09/22/16 1013    Education provided No          PT Short Term Goals - 09/17/16 1019      PT SHORT TERM GOAL #1   Title Pt will demonstrate proper recall of current HEP.   Status Achieved  PT Long Term Goals - 09/08/16 1449      PT LONG TERM GOAL #1   Title Pt will decrease swelling in left knee measurements to mathc the right knee in order to help normalize gait pattern.   Time 8   Period Weeks   Status New     PT LONG TERM GOAL #2   Title Pt will increase AROM flexion to 120 degrees and AROM extension to neutral in order to help normalize gait and functional movements.   Time 8   Period Weeks   Status New     PT LONG TERM GOAL #3   Title Pt will be able to ambulate 1000 feet in 6 minutes without AD and no stumbles in order to increase walking activity level.   Time 8   Period Weeks   Status New               Plan - 09/22/16 1019    Clinical Impression Statement Pt tolerated treatment well however she struggled today due to stiffness and pain with manual flexion of the left knee. Continue to progress per pt tolerancen and contine to work on active and passive ROM in order to  reach goals set.    Rehab Potential Good   PT Frequency 3x / week   PT Duration 8 weeks   PT Treatment/Interventions Cryotherapy;Electrical Stimulation;ADLs/Self Care Home Management;Moist Heat;Gait training;Stair training;Functional mobility training;Patient/family education;Neuromuscular re-education;Balance training;Therapeutic exercise;Therapeutic activities;Ultrasound;Manual techniques;Compression bandaging;Passive range of motion;Vasopneumatic Device;Energy conservation   PT Next Visit Plan focus on gait and AROM/PROM   PT Home Exercise Plan Continue with original HEP   Consulted and Agree with Plan of Care Patient      Patient will benefit from skilled therapeutic intervention in order to improve the following deficits and impairments:  Decreased balance, Decreased activity tolerance, Abnormal gait, Decreased coordination, Decreased endurance, Decreased mobility, Decreased strength, Decreased range of motion, Difficulty walking, Hypomobility, Increased edema, Impaired sensation, Impaired flexibility, Impaired tone, Improper body mechanics, Pain  Visit Diagnosis: Acute pain of left knee  Difficulty in walking, not elsewhere classified  Stiffness of left knee, not elsewhere classified  Localized swelling, mass and lump, left lower limb     Problem List Patient Active Problem List   Diagnosis Date Noted  . OA (osteoarthritis) of knee 08/23/2016  . Lobar pneumonia (HCC) 10/21/2015  . Cough 10/20/2015    Tamsen MeekBrian Cade Jaimes Eckert, SPT 09/22/2016, 10:22 AM  [facility] [address] [place], [place], [address] Phone: [phone]   Fax:  [phone]  Name: Kristin Mckinney MRN: [id number] Date of Birth: 1955-12-02

## 2016-09-24 ENCOUNTER — Ambulatory Visit: Payer: 59 | Admitting: Physical Therapy

## 2016-09-24 ENCOUNTER — Encounter: Payer: Self-pay | Admitting: Physical Therapy

## 2016-09-24 DIAGNOSIS — M25662 Stiffness of left knee, not elsewhere classified: Secondary | ICD-10-CM

## 2016-09-24 DIAGNOSIS — R2242 Localized swelling, mass and lump, left lower limb: Secondary | ICD-10-CM | POA: Diagnosis not present

## 2016-09-24 DIAGNOSIS — M25562 Pain in left knee: Secondary | ICD-10-CM

## 2016-09-24 DIAGNOSIS — R262 Difficulty in walking, not elsewhere classified: Secondary | ICD-10-CM | POA: Diagnosis not present

## 2016-09-24 NOTE — Therapy (Signed)
[facility] [address] , [place], [address] Phone: [phone]   Fax:  [phone]  Physical Therapy Treatment  Patient Details  Name: Kristin Mckinney MRN: [id number] Date of Birth: 04/27/1956 Referring Provider: Dr. Alusio  Encounter Date: 09/24/2016      PT End of Session - 09/24/16 1113    Visit Number 7   Number of Visits 24   Date for PT Re-Evaluation 11/03/16   PT Start Time 0923   PT Stop Time 1025   PT Time Calculation (min) 62 min   Activity Tolerance Patient tolerated treatment well   Behavior During Therapy WFL for tasks assessed/performed      Past Medical History:  Diagnosis Date  . Arthritis    osteoarthritis -knees  . Pneumonia    Jan 2017- no residual problems    Past Surgical History:  Procedure Laterality Date  . CERVICAL DISC SURGERY     10 years ago- anterior  . INJECTION KNEE Right 08/23/2016   Procedure: RIGHT KNEE CORTISONE INJECTION;  Surgeon: Frank Aluisio, MD;  [facility];  Service: Orthopedics;  Laterality: Right;  . KNEE SURGERY Right    "ACL reapir -Dr. collins- 5 yrs ago   . TONSILLECTOMY    . TOTAL KNEE ARTHROPLASTY Left 08/23/2016   Procedure: LEFT TOTAL KNEE ARTHROPLASTY;  Surgeon: Frank Aluisio, MD;  [facility];  Service: Orthopedics;  Laterality: Left;    There were no vitals filed for this visit.      Subjective Assessment - 09/24/16 0954    Subjective Reports that she is very stiff, reports that she has stopped using the cane, reports that she continues to walk stiff legged and that this is a habit   Currently in Pain? Yes   Pain Score 4    Pain Location Knee   Pain Orientation Left;Anterior   Pain Descriptors / Indicators Tightness   Aggravating Factors  bending                         [facility] Adult PT Treatment/Exercise - 09/24/16 0001      Ambulation/Gait   Gait Comments stairs step over step up and down, worked on  gait and had her exaggerate a step due to her stiff legged walk that she reports is a habit, worked on backward walking and stepping over obstacles to decrease the habit     Wheelchair Mobility   Comments wheelchair push and pull to get ROM     Knee/Hip Exercises: Aerobic   Elliptical R=6 I = 12 x 4 mintes   Nustep NuStep lvl 6, 8 minutes     Knee/Hip Exercises: Machines for Strengthening   Cybex Knee Extension 5# x10 and then left eccentrics x 10   Cybex Knee Flexion 25# 2x10   Cybex Leg Press 20# 2x10, then left only no weight 2x10     Manual Therapy   Manual Therapy Joint mobilization;Passive ROM   Joint Mobilization AP tib on femur, AP femur on tib   Soft tissue mobilization patella mobilization   Passive ROM Hamstring stretch, SKC   Muscle Energy Technique contract relax to assist knee extension                  PT Short Term Goals - 09/17/16 1019      PT SHORT TERM GOAL #1   Title Pt will demonstrate proper recall of current HEP.   Status   Achieved           PT Long Term Goals - 09/24/16 1117      PT LONG TERM GOAL #1   Title Pt will decrease swelling in left knee measurements to mathc the right knee in order to help normalize gait pattern.   Status Partially Met     PT LONG TERM GOAL #2   Title Pt will increase AROM flexion to 120 degrees and AROM extension to neutral in order to help normalize gait and functional movements.   Status On-going     PT LONG TERM GOAL #3   Title Pt will be able to ambulate 1000 feet in 6 minutes without AD and no stumbles in order to increase walking activity level.   Status On-going               Plan - 09/24/16 1114    Clinical Impression Statement Patient remains very stiff and needs to have cues to bend the knee in a nutural way.  She has good ROM but when walking she will only bend the knee to about 30 degrees, c/o a pulling across the front of the knee   PT Next Visit Plan may write MD note next visit    Consulted and Agree with Plan of Care Patient      Patient will benefit from skilled therapeutic intervention in order to improve the following deficits and impairments:  Decreased balance, Decreased activity tolerance, Abnormal gait, Decreased coordination, Decreased endurance, Decreased mobility, Decreased strength, Decreased range of motion, Difficulty walking, Hypomobility, Increased edema, Impaired sensation, Impaired flexibility, Impaired tone, Improper body mechanics, Pain  Visit Diagnosis: Acute pain of left knee  Difficulty in walking, not elsewhere classified  Stiffness of left knee, not elsewhere classified     Problem List Patient Active Problem List   Diagnosis Date Noted  . OA (osteoarthritis) of knee 08/23/2016  . Lobar pneumonia ([place]) 10/21/2015  . Cough 10/20/2015    Kristin Boast., PT 09/24/2016, 11:19 AM  [address] [place], [place], [address] Phone: [phone]   Fax:  [phone]  Name: Kristin Mckinney MRN: [id number] Date of Birth: July 27, 1956

## 2016-09-27 ENCOUNTER — Other Ambulatory Visit: Payer: Self-pay | Admitting: *Deleted

## 2016-09-27 ENCOUNTER — Ambulatory Visit: Payer: 59 | Admitting: Physical Therapy

## 2016-09-27 ENCOUNTER — Encounter: Payer: Self-pay | Admitting: Physical Therapy

## 2016-09-27 DIAGNOSIS — R2242 Localized swelling, mass and lump, left lower limb: Secondary | ICD-10-CM | POA: Diagnosis not present

## 2016-09-27 DIAGNOSIS — R262 Difficulty in walking, not elsewhere classified: Secondary | ICD-10-CM

## 2016-09-27 DIAGNOSIS — M25562 Pain in left knee: Secondary | ICD-10-CM | POA: Diagnosis not present

## 2016-09-27 DIAGNOSIS — M25662 Stiffness of left knee, not elsewhere classified: Secondary | ICD-10-CM | POA: Diagnosis not present

## 2016-09-27 NOTE — Patient Outreach (Addendum)
[facility] Care Management  09/27/2016  Caleen JobsJayne P Mckinney January 10, 1956 [id number]   Subjective: Telephone call to patient's home / mobile number, no answer, left HIPAA compliant voicemail message, and requested call back.   Objective: Per chart review: Patient hospitalized 08/23/16 - 11/15/ 17 for OsteoarthritisBilateralknee(s).  Status post LeftTotal Knee Arthroplasty and Right Knee cortisone injection on 08/23/16.     Assessment: Received UMR Transition of care referral on 08/24/16.   Transition of care follow up pending patient contact.       Plan: RNCM will call patient for 3rd telephone outreach attempt, transition of care follow up, within 10 business days, if no return call.   Shikara Mcauliffe H. Gardiner Barefootooper RN, BSN, CCM [facility] Care Management [facility] Telephonic CM Phone: [phone] Fax: [phone]

## 2016-09-27 NOTE — Therapy (Signed)
[facility] [address] [place], [place], [address] Phone: [phone]   Fax:  [phone]  Physical Therapy Treatment  Patient Details  Name: Kristin Mckinney MRN: [id number] Date of Birth: 1956/09/17 Referring Provider: Dr. Despina HickAlusio  Encounter Date: 09/27/2016      PT End of Session - 09/27/16 1106    Visit Number 8   Date for PT Re-Evaluation 11/03/16   PT Start Time 1015   PT Stop Time 1110   PT Time Calculation (min) 55 min   Activity Tolerance Patient limited by pain   Behavior During Therapy [facility] for tasks assessed/performed      Past Medical History:  Diagnosis Date  . Arthritis    osteoarthritis -knees  . Pneumonia    Jan 2017- no residual problems    Past Surgical History:  Procedure Laterality Date  . CERVICAL DISC SURGERY     10 years ago- anterior  . INJECTION KNEE Right 08/23/2016   Procedure: RIGHT KNEE CORTISONE INJECTION;  Surgeon: Ollen GrossFrank Aluisio, MD;  Location: [facility];  Service: Orthopedics;  Laterality: Right;  . KNEE SURGERY Right    "ACL reapir -Dr. Thomasena Ediscollins- 5 yrs ago   . TONSILLECTOMY    . TOTAL KNEE ARTHROPLASTY Left 08/23/2016   Procedure: LEFT TOTAL KNEE ARTHROPLASTY;  Surgeon: Ollen GrossFrank Aluisio, MD;  Location[facility];  Service: Orthopedics;  Laterality: Left;    There were no vitals filed for this visit.      Subjective Assessment - 09/27/16 1030    Subjective Pt reports she is doing well, just "a little stiff". She states that the medial portion of the right knee has began hurting the last couple of days. Pt does report that she fell on Saturday practicing going down stairs. She states that in order to catch herself she "threw" her knee into the vetical post. She stats that the knee felt like it buckled.   Currently in Pain? Yes   Pain Score 3    Pain Location Knee   Pain Orientation Left;Anterior            [facility] PT Assessment - 09/27/16 0001      AROM   Left Knee  Extension 15  seated. Supine unable to measure due to pain   Left Knee Flexion 95  seated     PROM   Left Knee Flexion 100                     [facility] Adult PT Treatment/Exercise - 09/27/16 0001      Knee/Hip Exercises: Aerobic   Stationary Bike 5 minutes fwd   Elliptical lvl 8, 5 min fwd, 4 min back     Knee/Hip Exercises: Machines for Strengthening   Cybex Knee Flexion 25 LB, 2X15   Cybex Leg Press 40 lb, 2x10, 20 lb, left leg only x10  calf raises with 60 lb, 2x20     Manual Therapy   Manual Therapy Joint mobilization;Passive ROM   Joint Mobilization AP tib on femur  discontinued due to pain   Passive ROM HS, gastroc                PT Education - 09/27/16 1106    Education provided No          PT Short Term Goals - 09/17/16 1019      PT SHORT TERM GOAL #1   Title Pt will demonstrate proper recall of current HEP.  Status Achieved           PT Long Term Goals - 09/27/16 1111      PT LONG TERM GOAL #1   Status On-going     PT LONG TERM GOAL #2   Status On-going     PT LONG TERM GOAL #3   Status On-going               Plan - 09/27/16 1110    Rehab Potential Good   PT Frequency 3x / week   PT Duration 8 weeks   PT Treatment/Interventions Cryotherapy;Electrical Stimulation;ADLs/Self Care Home Management;Moist Heat;Gait training;Stair training;Functional mobility training;Patient/family education;Neuromuscular re-education;Balance training;Therapeutic exercise;Therapeutic activities;Ultrasound;Manual techniques;Compression bandaging;Passive range of motion;Vasopneumatic Device;Energy conservation   PT Home Exercise Plan Continue with original HEP   Consulted and Agree with Plan of Care Patient      Patient will benefit from skilled therapeutic intervention in order to improve the following deficits and impairments:  Decreased balance, Decreased activity tolerance, Abnormal gait, Decreased coordination, Decreased endurance,  Decreased mobility, Decreased strength, Decreased range of motion, Difficulty walking, Hypomobility, Increased edema, Impaired sensation, Impaired flexibility, Impaired tone, Improper body mechanics, Pain  Visit Diagnosis: Acute pain of left knee  Difficulty in walking, not elsewhere classified  Stiffness of left knee, not elsewhere classified  Localized swelling, mass and lump, left lower limb     Problem List Patient Active Problem List   Diagnosis Date Noted  . OA (osteoarthritis) of knee 08/23/2016  . Lobar pneumonia (HCC) 10/21/2015  . Cough 10/20/2015    Tamsen MeekBrian Cade Terrisa Curfman, SPT 09/27/2016, 11:14 AM  [facility] [address] [place], [place], [address] Phone: [phone]   Fax:  [phone]  Name: Kristin Mckinney MRN: [id number] Date of Birth: [birth year]

## 2016-09-28 ENCOUNTER — Encounter: Payer: Self-pay | Admitting: *Deleted

## 2016-09-28 ENCOUNTER — Other Ambulatory Visit: Payer: Self-pay | Admitting: *Deleted

## 2016-09-28 DIAGNOSIS — Z471 Aftercare following joint replacement surgery: Secondary | ICD-10-CM | POA: Diagnosis not present

## 2016-09-28 DIAGNOSIS — Z96652 Presence of left artificial knee joint: Secondary | ICD-10-CM | POA: Diagnosis not present

## 2016-09-28 MED FILL — traMADol HCL 50 MG TABS: 50 | 10 days supply | Qty: 80 | Fill #0

## 2016-09-28 NOTE — Patient Outreach (Addendum)
[facility] Care Management  09/28/2016  Kristin JobsJayne P Mcqueary Sep 17, 1956 [id number]   Subjective: Telephone call to patient's home / mobile number, no answer, left HIPAA compliant voicemail message, and requested call back. Telephone call from patient and HIPAA verified.  Discussed [facility] Care Management [facility] Transition of care follow up and patient in agreement to complete follow up.   Patient states she is doing very well, energy level increasing, [facility], and following activity restrictions.   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  Has had follow up appointments with surgeon and has received  good progress reports.  Patient states [facility].  States she does not have supplemental insurance (hospital indemnity ).  Has family medical leave act (FMLA) in place.  Patient states she does not have any transition of care, care coordination, disease management, disease monitoring, transportation, community resource, or pharmacy needs at this time.  States she is very appreciative of the follow up call and is in agreement to receive [facility] Care [facility] information.     Objective:Per chart review: Patient hospitalized 08/23/16 - 11/15/ 17 for OsteoarthritisBilateralknee(s). Status post LeftTotal Knee Arthroplasty and Right Knee cortisone injection on 08/23/16.    Assessment:Received [facility] Transition of care referral on 08/24/16. Transition of care follow up completed, no care management needs, and will proceed with case closure.    Plan:RNCM will send patient successful outreach letter, [facility] pamphlet, and magnet. RNCM will send case closure due to follow up completed / no care management needs to Iverson AlaminLaura Greeson at [facility].     Maryana Pittmon H. Gardiner Barefootooper RN, BSN, CCM [facility] Care Management [facility] Telephonic CM Phone: [phone]

## 2016-09-29 ENCOUNTER — Ambulatory Visit: Payer: 59 | Admitting: Physical Therapy

## 2016-09-29 ENCOUNTER — Encounter: Payer: Self-pay | Admitting: Physical Therapy

## 2016-09-29 DIAGNOSIS — M25662 Stiffness of left knee, not elsewhere classified: Secondary | ICD-10-CM | POA: Diagnosis not present

## 2016-09-29 DIAGNOSIS — R2242 Localized swelling, mass and lump, left lower limb: Secondary | ICD-10-CM

## 2016-09-29 DIAGNOSIS — M25562 Pain in left knee: Secondary | ICD-10-CM

## 2016-09-29 DIAGNOSIS — R262 Difficulty in walking, not elsewhere classified: Secondary | ICD-10-CM

## 2016-09-29 NOTE — Therapy (Signed)
[place] [address] [place], [place], [address] Phone: [phone]   Fax:  [phone]  Physical Therapy Treatment  Patient Details  Name: Kristin Mckinney MRN: [id number] Date of Birth: [birth year] Referring Provider: Dr. Maureen Ralphs  Encounter Date: 09/29/2016      PT End of Session - 09/29/16 1112    Visit Number 9   Number of Visits 24   Date for PT Re-Evaluation 11/03/16   PT Start Time 0922   PT Stop Time 1035   PT Time Calculation (min) 73 min   Activity Tolerance Patient tolerated treatment well   Behavior During Therapy [facility] for tasks assessed/performed      Past Medical History:  Diagnosis Date  . Arthritis    osteoarthritis -knees  . Pneumonia    Jan 2017- no residual problems    Past Surgical History:  Procedure Laterality Date  . CERVICAL DISC SURGERY     10 years ago- anterior  . INJECTION KNEE Right 08/23/2016   Procedure: RIGHT KNEE CORTISONE INJECTION;  Surgeon: Gaynelle Arabian, MD;  [facility];  Service: Orthopedics;  Laterality: Right;  . KNEE SURGERY Right    "ACL reapir -Dr. Theda Sers- 5 yrs ago   . TONSILLECTOMY    . TOTAL KNEE ARTHROPLASTY Left 08/23/2016   Procedure: LEFT TOTAL KNEE ARTHROPLASTY;  Surgeon: Gaynelle Arabian, MD;  [facility];  Service: Orthopedics;  Laterality: Left;    There were no vitals filed for this visit.      Subjective Assessment - 09/29/16 0928    Subjective Pt reports that Dr. Maureen Ralphs was pleased with her progress and AROM. She is reporting much less pain today.    Currently in Pain? Yes   Pain Score 2                          [facility] Adult PT Treatment/Exercise - 09/29/16 0001      Ambulation/Gait   Gait Comments stairs step over step up and down, worked a lot on gait to decrease the hip hike and the poor knee flexion, a lot of verbal and tactile cues, worked on walking backwards as well to overcome the habit of no knee flexion     High Level Balance   High Level Balance Comments resisted gait all directions, cues to bend the knee     Knee/Hip Exercises: Aerobic   Stationary Bike 5 minutes fwd   Elliptical lvl 8, 5 min fwd, 4 min back   Nustep NuStep lvl 6, 8 minutes     Knee/Hip Exercises: Machines for Strengthening   Cybex Knee Extension 5# 2x10 and then eccentrics with the left, then left only 4x5 reps   Cybex Knee Flexion 35# x10, then left only 15# 3x10   Cybex Leg Press 40 lb, 2x10, 20 lb, left leg only x10, then no weight really working on deeper flexion, both legs and left only     Vasopneumatic   Number Minutes Vasopneumatic  15 minutes   Vasopnuematic Location  Knee   Vasopneumatic Pressure Medium   Vasopneumatic Temperature  35     Manual Therapy   Manual Therapy Joint mobilization;Passive ROM   Passive ROM PROM of the left knee, some contract relax, various ways, sitting and supine                  PT Short Term Goals - 09/17/16 1019  PT SHORT TERM GOAL #1   Title Pt will demonstrate proper recall of current HEP.   Status Achieved           PT Long Term Goals - 09/29/16 1114      PT LONG TERM GOAL #1   Title Pt will decrease swelling in left knee measurements to mathc the right knee in order to help normalize gait pattern.   Status Partially Met     PT LONG TERM GOAL #2   Title Pt will increase AROM flexion to 120 degrees and AROM extension to neutral in order to help normalize gait and functional movements.   Status On-going               Plan - 09/29/16 1112    Clinical Impression Statement Reports MD was pleased with the ROM just did not like her gait pattern.  She seemed to tolerate the treatment today better than on Monday.  Still cannot bend the knee to walk, even though she demonstrates 94 degrees flexion.  She c/o a catch   PT Next Visit Plan Focus on the gait and ROM   Consulted and Agree with Plan of Care Patient      Patient will benefit from  skilled therapeutic intervention in order to improve the following deficits and impairments:  Decreased balance, Decreased activity tolerance, Abnormal gait, Decreased coordination, Decreased endurance, Decreased mobility, Decreased strength, Decreased range of motion, Difficulty walking, Hypomobility, Increased edema, Impaired sensation, Impaired flexibility, Impaired tone, Improper body mechanics, Pain  Visit Diagnosis: Acute pain of left knee  Difficulty in walking, not elsewhere classified  Stiffness of left knee, not elsewhere classified  Localized swelling, mass and lump, left lower limb     Problem List Patient Active Problem List   Diagnosis Date Noted  . OA (osteoarthritis) of knee 08/23/2016  . Lobar pneumonia ([place]) 10/21/2015  . Cough 10/20/2015    Sumner Boast., PT 09/29/2016, 11:15 AM  [address] [place], [place], [address] Phone: [phone]   Fax:  [phone]  Name: Kristin Mckinney MRN: [id number] Date of Birth: Feb 26, 1956

## 2016-10-01 ENCOUNTER — Ambulatory Visit: Payer: 59 | Admitting: Physical Therapy

## 2016-10-01 ENCOUNTER — Encounter: Payer: Self-pay | Admitting: Physical Therapy

## 2016-10-01 DIAGNOSIS — M25662 Stiffness of left knee, not elsewhere classified: Secondary | ICD-10-CM | POA: Diagnosis not present

## 2016-10-01 DIAGNOSIS — R262 Difficulty in walking, not elsewhere classified: Secondary | ICD-10-CM

## 2016-10-01 DIAGNOSIS — M25562 Pain in left knee: Secondary | ICD-10-CM

## 2016-10-01 DIAGNOSIS — R2242 Localized swelling, mass and lump, left lower limb: Secondary | ICD-10-CM | POA: Diagnosis not present

## 2016-10-01 NOTE — Therapy (Signed)
[place] [address] [address] [place], [place], [address] Phone: [phone]   Fax:  [phone]  Physical Therapy Treatment  Patient Details  Name: Kristin Mckinney MRN: [id number] Date of Birth: 05/10/1956 Referring Provider: Dr. Maureen Ralphs  Encounter Date: 10/01/2016      PT End of Session - 10/01/16 1011    Visit Number 10   Number of Visits 24   Date for PT Re-Evaluation 11/03/16   PT Start Time 0920   PT Stop Time 1010   PT Time Calculation (min) 50 min   Activity Tolerance Patient tolerated treatment well   Behavior During Therapy [facility] for tasks assessed/performed      Past Medical History:  Diagnosis Date  . Arthritis    osteoarthritis -knees  . Pneumonia    Jan 2017- no residual problems    Past Surgical History:  Procedure Laterality Date  . CERVICAL DISC SURGERY     10 years ago- anterior  . INJECTION KNEE Right 08/23/2016   Procedure: RIGHT KNEE CORTISONE INJECTION;  Surgeon: Gaynelle Arabian, MD;  [facility];  Service: Orthopedics;  Laterality: Right;  . KNEE SURGERY Right    "ACL reapir -Dr. Theda Sers- 5 yrs ago   . TONSILLECTOMY    . TOTAL KNEE ARTHROPLASTY Left 08/23/2016   Procedure: LEFT TOTAL KNEE ARTHROPLASTY;  Surgeon: Gaynelle Arabian, MD;  [facility];  Service: Orthopedics;  Laterality: Left;    There were no vitals filed for this visit.      Subjective Assessment - 10/01/16 0920    Subjective "The L knee I really worked it yesterday with my gait, I feel like I broke through 2/3 of what was holding me back."   Currently in Pain? Yes   Pain Score 2    Pain Location Knee   Pain Orientation Left                         [facility] Adult PT Treatment/Exercise - 10/01/16 0001      Knee/Hip Exercises: Aerobic   Stationary Bike 5 minutes fwd   Elliptical I 10 R 5, 2 min fwd, 2 min back   Nustep NuStep lvl 4, 4 minutes     Knee/Hip Exercises: Machines for Strengthening   Cybex Knee Extension LLE 5lb 4x5    Cybex Leg Press LLE only no weight x5; 40lb 2x5     Knee/Hip Exercises: Seated   Long Arc Quad 2 sets;Left;10 reps   Long Arc Quad Weight 3 lbs.   Hamstring Curl 3 sets;10 reps   Hamstring Limitations blue Tband      Knee/Hip Exercises: Supine   Quad Sets Left;2 sets;10 reps  with over pressure from therapist    Short Arc Quad Sets 2 sets;10 reps;Strengthening;Left   Short Arc Quad Sets Limitations 3     Manual Therapy   Passive ROM PROM of the left knee, some contract relax, various ways, sitting and supine                  PT Short Term Goals - 09/17/16 1019      PT SHORT TERM GOAL #1   Title Pt will demonstrate proper recall of current HEP.   Status Achieved           PT Long Term Goals - 10/01/16 1016      PT LONG TERM GOAL #1   Title Pt will decrease swelling in left  knee measurements to mathc the right knee in order to help normalize gait pattern.   Status Partially Met     PT LONG TERM GOAL #2   Title Pt will increase AROM flexion to 120 degrees and AROM extension to neutral in order to help normalize gait and functional movements.   Status On-going     PT LONG TERM GOAL #3   Title Pt will be able to ambulate 1000 feet in 6 minutes without AD and no stumbles in order to increase walking activity level.   Status On-going               Plan - 10/01/16 1012    Clinical Impression Statement Pt with some pain with manual therapy and end ranges of L knee flexion and extension. Pt with some weakness with machine level knee extension. Pt completed all exercises well and gives good effort.   Rehab Potential Good   PT Frequency 3x / week   PT Duration 8 weeks   PT Treatment/Interventions Cryotherapy;Electrical Stimulation;ADLs/Self Care Home Management;Moist Heat;Gait training;Stair training;Functional mobility training;Patient/family education;Neuromuscular re-education;Balance training;Therapeutic  exercise;Therapeutic activities;Ultrasound;Manual techniques;Compression bandaging;Passive range of motion;Vasopneumatic Device;Energy conservation   PT Next Visit Plan Focus on the gait and ROM      Patient will benefit from skilled therapeutic intervention in order to improve the following deficits and impairments:  Decreased balance, Decreased activity tolerance, Abnormal gait, Decreased coordination, Decreased endurance, Decreased mobility, Decreased strength, Decreased range of motion, Difficulty walking, Hypomobility, Increased edema, Impaired sensation, Impaired flexibility, Impaired tone, Improper body mechanics, Pain  Visit Diagnosis: Acute pain of left knee  Difficulty in walking, not elsewhere classified  Stiffness of left knee, not elsewhere classified  Localized swelling, mass and lump, left lower limb     Problem List Patient Active Problem List   Diagnosis Date Noted  . OA (osteoarthritis) of knee 08/23/2016  . Lobar pneumonia ([place]) 10/21/2015  . Cough 10/20/2015    Scot Jun, PTA 10/01/2016, 10:17 AM  [address] [place], [place], [address] Phone: [phone]   Fax:  [phone]  Name: JIMMY STIPES MRN: [id number] Date of Birth: 08-29-1956

## 2016-10-06 ENCOUNTER — Encounter: Payer: Self-pay | Admitting: Physical Therapy

## 2016-10-06 ENCOUNTER — Ambulatory Visit: Payer: 59 | Admitting: Physical Therapy

## 2016-10-06 DIAGNOSIS — R262 Difficulty in walking, not elsewhere classified: Secondary | ICD-10-CM

## 2016-10-06 DIAGNOSIS — R2242 Localized swelling, mass and lump, left lower limb: Secondary | ICD-10-CM

## 2016-10-06 DIAGNOSIS — M25562 Pain in left knee: Secondary | ICD-10-CM

## 2016-10-06 DIAGNOSIS — M25662 Stiffness of left knee, not elsewhere classified: Secondary | ICD-10-CM | POA: Diagnosis not present

## 2016-10-06 MED FILL — HYDROmorphone HCL 2 MG TABS: 2 | 7 days supply | Qty: 40 | Fill #0

## 2016-10-06 NOTE — Therapy (Signed)
[place] [address] [place], [place], [address] Phone: [phone]   Fax:  [phone]  Physical Therapy Treatment  Patient Details  Name: Kristin Mckinney MRN: [id number] Date of Birth: Apr 20, 1956 Referring Provider: Dr. Maureen Ralphs  Encounter Date: 10/06/2016      PT End of Session - 10/06/16 1002    Visit Number 11   Number of Visits 24   Date for PT Re-Evaluation 11/03/16   PT Start Time 0918   PT Stop Time 1016   PT Time Calculation (min) 58 min   Activity Tolerance Patient tolerated treatment well   Behavior During Therapy [facility] for tasks assessed/performed      Past Medical History:  Diagnosis Date  . Arthritis    osteoarthritis -knees  . Pneumonia    Jan 2017- no residual problems    Past Surgical History:  Procedure Laterality Date  . CERVICAL DISC SURGERY     10 years ago- anterior  . INJECTION KNEE Right 08/23/2016   Procedure: RIGHT KNEE CORTISONE INJECTION;  Surgeon: Gaynelle Arabian, MD;  [facility];  Service: Orthopedics;  Laterality: Right;  . KNEE SURGERY Right    "ACL reapir -Dr. Theda Sers- 5 yrs ago   . TONSILLECTOMY    . TOTAL KNEE ARTHROPLASTY Left 08/23/2016   Procedure: LEFT TOTAL KNEE ARTHROPLASTY;  Surgeon: Gaynelle Arabian, MD;  [facility];  Service: Orthopedics;  Laterality: Left;    There were no vitals filed for this visit.      Subjective Assessment - 10/06/16 0919    Subjective "My knee ballooned up on me after last session"   Currently in Pain? No/denies   Pain Score 0-No pain            [facility] PT Assessment - 10/06/16 0001      AROM   Left Knee Extension 8   Left Knee Flexion 103                     [facility] Adult PT Treatment/Exercise - 10/06/16 0001      Knee/Hip Exercises: Aerobic   Stationary Bike 5 minutes fwd seat 7   Elliptical I 10 R 5, 2 min fwd, 2 min back   Nustep NuStep lvl 5, 4 minutes     Knee/Hip Exercises: Machines for  Strengthening   Cybex Leg Press LLE TKE 40LB 2x10, LLE only no weight      Knee/Hip Exercises: Standing   Other Standing Knee Exercises 40# left hip/knee press down with pulley system     Knee/Hip Exercises: Seated   Long Arc Quad 2 sets;Left;10 reps   Long Arc Quad Weight 3 lbs.   Hamstring Curl 2 sets;15 reps   Hamstring Limitations blue Tband      Knee/Hip Exercises: Supine   Quad Sets Left;10 reps;1 set  over pressure from therapist   Short Arc Quad Sets 2 sets;10 reps;Strengthening;Left   Short Arc Quad Sets Limitations 3     Vasopneumatic   Number Minutes Vasopneumatic  15 minutes   Vasopnuematic Location  Knee   Vasopneumatic Pressure High   Vasopneumatic Temperature  32     Manual Therapy   Passive ROM PROM of the L knee                   PT Short Term Goals - 09/17/16 1019      PT SHORT TERM GOAL #1   Title Pt will demonstrate proper  recall of current HEP.   Status Achieved           PT Long Term Goals - 10/06/16 1005      PT LONG TERM GOAL #1   Title Pt will decrease swelling in left knee measurements to mathc the right knee in order to help normalize gait pattern.   Status Partially Met     PT LONG TERM GOAL #2   Title Pt will increase AROM flexion to 120 degrees and AROM extension to neutral in order to help normalize gait and functional movements.   Status On-going     PT LONG TERM GOAL #3   Title Pt will be able to ambulate 1000 feet in 6 minutes without AD and no stumbles in order to increase walking activity level.   Status On-going               Plan - 10/06/16 1003    Clinical Impression Statement Pt had progressed increasing her L knee AROM. Pt L knee AROM taken in supine. PT with some pain at the eng range of L knee flexion and extension with MT. Completed all exercises well with good effort.    Rehab Potential Good   PT Frequency 3x / week   PT Duration 8 weeks   PT Treatment/Interventions Cryotherapy;Electrical  Stimulation;ADLs/Self Care Home Management;Moist Heat;Gait training;Stair training;Functional mobility training;Patient/family education;Neuromuscular re-education;Balance training;Therapeutic exercise;Therapeutic activities;Ultrasound;Manual techniques;Compression bandaging;Passive range of motion;Vasopneumatic Device;Energy conservation   PT Next Visit Plan Focus on the gait and ROM      Patient will benefit from skilled therapeutic intervention in order to improve the following deficits and impairments:  Decreased balance, Decreased activity tolerance, Abnormal gait, Decreased coordination, Decreased endurance, Decreased mobility, Decreased strength, Decreased range of motion, Difficulty walking, Hypomobility, Increased edema, Impaired sensation, Impaired flexibility, Impaired tone, Improper body mechanics, Pain  Visit Diagnosis: Acute pain of left knee  Difficulty in walking, not elsewhere classified  Stiffness of left knee, not elsewhere classified  Localized swelling, mass and lump, left lower limb     Problem List Patient Active Problem List   Diagnosis Date Noted  . OA (osteoarthritis) of knee 08/23/2016  . Lobar pneumonia ([place]) 10/21/2015  . Cough 10/20/2015    Scot Jun 10/06/2016, 10:06 AM  [place] [address] [place], [place], [address] Phone: [phone]   Fax:  [phone]  Name: Kristin Mckinney MRN: [id number] Date of Birth: [birth year]

## 2016-10-08 ENCOUNTER — Encounter: Payer: Self-pay | Admitting: Physical Therapy

## 2016-10-08 ENCOUNTER — Ambulatory Visit: Payer: 59 | Admitting: Physical Therapy

## 2016-10-08 DIAGNOSIS — M25662 Stiffness of left knee, not elsewhere classified: Secondary | ICD-10-CM | POA: Diagnosis not present

## 2016-10-08 DIAGNOSIS — R2242 Localized swelling, mass and lump, left lower limb: Secondary | ICD-10-CM

## 2016-10-08 DIAGNOSIS — R262 Difficulty in walking, not elsewhere classified: Secondary | ICD-10-CM

## 2016-10-08 DIAGNOSIS — M25562 Pain in left knee: Secondary | ICD-10-CM

## 2016-10-08 NOTE — Therapy (Signed)
[place] [place], [place], [address] Phone: [phone]   Fax:  [phone]  Physical Therapy Treatment  Patient Details  Name: Kristin Mckinney MRN: [id number] Date of Birth: 1956-08-02 Referring Provider: Dr. Maureen Ralphs  Encounter Date: 10/08/2016      PT End of Session - 10/08/16 1008    Visit Number 12   Number of Visits 24   Date for PT Re-Evaluation 11/03/16   PT Start Time 0918   PT Stop Time 1021   PT Time Calculation (min) 63 min   Activity Tolerance Patient tolerated treatment well   Behavior During Therapy [facility] for tasks assessed/performed      Past Medical History:  Diagnosis Date  . Arthritis    osteoarthritis -knees  . Pneumonia    Jan 2017- no residual problems    Past Surgical History:  Procedure Laterality Date  . CERVICAL DISC SURGERY     10 years ago- anterior  . INJECTION KNEE Right 08/23/2016   Procedure: RIGHT KNEE CORTISONE INJECTION;  Surgeon: Gaynelle Arabian, MD;  Location: [facility];  Service: Orthopedics;  Laterality: Right;  . KNEE SURGERY Right    "ACL reapir -Dr. Theda Sers- 5 yrs ago   . TONSILLECTOMY    . TOTAL KNEE ARTHROPLASTY Left 08/23/2016   Procedure: LEFT TOTAL KNEE ARTHROPLASTY;  Surgeon: Gaynelle Arabian, MD;  Location: [facility];  Service: Orthopedics;  Laterality: Left;    There were no vitals filed for this visit.      Subjective Assessment - 10/08/16 0918    Subjective "Good, Ive been pressing down in my knee while keeping it straight"   Currently in Pain? No/denies   Pain Score 0-No pain                         [facility] Adult PT Treatment/Exercise - 10/08/16 0001      Knee/Hip Exercises: Aerobic   Stationary Bike 6 minutes fwd seat 47   Nustep NuStep lvl 5, 4 minutes  No UE     Knee/Hip Exercises: Machines for Strengthening   Cybex Knee Extension 5lb 2x10    Cybex Knee Flexion LLE 15lb 2x10   Cybex Leg Press 40lb 2x10 L3; TKE LLE 40lb  2x10     Knee/Hip Exercises: Standing   Step Down 2 sets;Left;Hand Hold: 2;10 reps;Step Height: 4"   Other Standing Knee Exercises Toe taps 8in boc 2x15      Vasopneumatic   Number Minutes Vasopneumatic  15 minutes   Vasopnuematic Location  Knee   Vasopneumatic Pressure High   Vasopneumatic Temperature  32     Manual Therapy   Manual Therapy Joint mobilization;Passive ROM;Soft tissue mobilization   Soft tissue mobilization L quad    Passive ROM PROM of the L knee                   PT Short Term Goals - 09/17/16 1019      PT SHORT TERM GOAL #1   Title Pt will demonstrate proper recall of current HEP.   Status Achieved           PT Long Term Goals - 10/06/16 1005      PT LONG TERM GOAL #1   Title Pt will decrease swelling in left knee measurements to mathc the right knee in order to help normalize gait pattern.   Status Partially Met     PT LONG TERM GOAL #  2   Title Pt will increase AROM flexion to 120 degrees and AROM extension to neutral in order to help normalize gait and functional movements.   Status On-going     PT LONG TERM GOAL #3   Title Pt will be able to ambulate 1000 feet in 6 minutes without AD and no stumbles in order to increase walking activity level.   Status On-going               Plan - 10/08/16 1008    Clinical Impression Statement Pt with good TKE with machine level extension. Pt still has some pain at end ranges of L knee flexion and extension MT. She continues to give good effort with all exercises.    Rehab Potential Good   PT Frequency 3x / week   PT Duration 8 weeks   PT Treatment/Interventions Cryotherapy;Electrical Stimulation;ADLs/Self Care Home Management;Moist Heat;Gait training;Stair training;Functional mobility training;Patient/family education;Neuromuscular re-education;Balance training;Therapeutic exercise;Therapeutic activities;Ultrasound;Manual techniques;Compression bandaging;Passive range of motion;Vasopneumatic  Device;Energy conservation   PT Next Visit Plan Focus on the gait and ROM      Patient will benefit from skilled therapeutic intervention in order to improve the following deficits and impairments:  Decreased balance, Decreased activity tolerance, Abnormal gait, Decreased coordination, Decreased endurance, Decreased mobility, Decreased strength, Decreased range of motion, Difficulty walking, Hypomobility, Increased edema, Impaired sensation, Impaired flexibility, Impaired tone, Improper body mechanics, Pain  Visit Diagnosis: Acute pain of left knee  Difficulty in walking, not elsewhere classified  Stiffness of left knee, not elsewhere classified  Localized swelling, mass and lump, left lower limb     Problem List Patient Active Problem List   Diagnosis Date Noted  . OA (osteoarthritis) of knee 08/23/2016  . Lobar pneumonia ([place]) 10/21/2015  . Cough 10/20/2015    Scot Jun 10/08/2016, 10:12 AM  [place] [place], [place], [address] Phone: [phone]   Fax:  [phone]  Name: Kristin Mckinney MRN: [id number] Date of Birth: January 18, 1956

## 2016-10-12 ENCOUNTER — Encounter: Payer: Self-pay | Admitting: Physical Therapy

## 2016-10-12 ENCOUNTER — Ambulatory Visit: Payer: 59 | Attending: Orthopedic Surgery | Admitting: Physical Therapy

## 2016-10-12 DIAGNOSIS — R262 Difficulty in walking, not elsewhere classified: Secondary | ICD-10-CM | POA: Diagnosis not present

## 2016-10-12 DIAGNOSIS — M25662 Stiffness of left knee, not elsewhere classified: Secondary | ICD-10-CM | POA: Insufficient documentation

## 2016-10-12 DIAGNOSIS — M25562 Pain in left knee: Secondary | ICD-10-CM | POA: Diagnosis not present

## 2016-10-12 DIAGNOSIS — R2242 Localized swelling, mass and lump, left lower limb: Secondary | ICD-10-CM | POA: Insufficient documentation

## 2016-10-12 NOTE — Therapy (Signed)
[place], [place], [address] Phone: [phone]   Fax:  [phone]  Physical Therapy Treatment  Patient Details  Name: Kristin Mckinney MRN: [id number] Date of Birth: 10/25/1955 Referring Provider: Dr. Maureen Ralphs  Encounter Date: 10/12/2016      PT End of Session - 10/12/16 1009    Visit Number 13   Number of Visits 24   Date for PT Re-Evaluation 11/03/16   PT Start Time 0930   PT Stop Time 1025   PT Time Calculation (min) 55 min   Activity Tolerance Patient tolerated treatment well   Behavior During Therapy [facility] for tasks assessed/performed      Past Medical History:  Diagnosis Date  . Arthritis    osteoarthritis -knees  . Pneumonia    Jan 2017- no residual problems    Past Surgical History:  Procedure Laterality Date  . CERVICAL DISC SURGERY     10 years ago- anterior  . INJECTION KNEE Right 08/23/2016   Procedure: RIGHT KNEE CORTISONE INJECTION;  Surgeon: Gaynelle Arabian, MD;  Location: [facility];  Service: Orthopedics;  Laterality: Right;  . KNEE SURGERY Right    "ACL reapir -Dr. Theda Sers- 5 yrs ago   . TONSILLECTOMY    . TOTAL KNEE ARTHROPLASTY Left 08/23/2016   Procedure: LEFT TOTAL KNEE ARTHROPLASTY;  Surgeon: Gaynelle Arabian, MD;  Location: [facility];  Service: Orthopedics;  Laterality: Left;    There were no vitals filed for this visit.      Subjective Assessment - 10/12/16 0932    Subjective "Im good, joints a little stiff in this cold"   Currently in Pain? Yes   Pain Score 2    Pain Location Knee   Pain Orientation Left;Right                         [facility] Adult PT Treatment/Exercise - 10/12/16 0001      Knee/Hip Exercises: Aerobic   Elliptical I 10 R 5, 2.5 min fwd, 2.5 min back   Nustep NuStep lvl 3, 3 minutes  No UE     Knee/Hip Exercises: Machines for Strengthening   Cybex Knee Extension 5lb 2x10, Eccentrics 10lb x10  LLE    Cybex Knee Flexion LLE 15lb  2x10   Cybex Leg Press 40lb 2x10 L4 then L3; TKE LLE 40lb 2x10; heel raises 40lb 2x20     Knee/Hip Exercises: Standing   Other Standing Knee Exercises Toe taps 8in box 2x15      Vasopneumatic   Number Minutes Vasopneumatic  15 minutes   Vasopnuematic Location  Knee   Vasopneumatic Pressure High   Vasopneumatic Temperature  32     Manual Therapy   Manual Therapy Passive ROM;Soft tissue mobilization   Soft tissue mobilization L quad    Passive ROM PROM of the L knee                   PT Short Term Goals - 09/17/16 1019      PT SHORT TERM GOAL #1   Title Pt will demonstrate proper recall of current HEP.   Status Achieved           PT Long Term Goals - 10/06/16 1005      PT LONG TERM GOAL #1   Title Pt will decrease swelling in left knee measurements to mathc the right knee in order to help normalize gait pattern.   Status  Partially Met     PT LONG TERM GOAL #2   Title Pt will increase AROM flexion to 120 degrees and AROM extension to neutral in order to help normalize gait and functional movements.   Status On-going     PT LONG TERM GOAL #3   Title Pt will be able to ambulate 1000 feet in 6 minutes without AD and no stumbles in order to increase walking activity level.   Status On-going               Plan - 10/12/16 1010    Clinical Impression Statement Pt with reports of increase stiffness this date due to cold weather. Continues to perform all exercises well with good effort. Assist needed to keep hip down with forward steps on 8in box. Pt continues to have increase swelling that could be impleading AROM.   Rehab Potential Good   PT Frequency 3x / week   PT Duration 8 weeks   PT Treatment/Interventions Cryotherapy;Electrical Stimulation;ADLs/Self Care Home Management;Moist Heat;Gait training;Stair training;Functional mobility training;Patient/family education;Neuromuscular re-education;Balance training;Therapeutic exercise;Therapeutic  activities;Ultrasound;Manual techniques;Compression bandaging;Passive range of motion;Vasopneumatic Device;Energy conservation   PT Next Visit Plan Focus on the gait and ROM      Patient will benefit from skilled therapeutic intervention in order to improve the following deficits and impairments:     Visit Diagnosis: Difficulty in walking, not elsewhere classified  Stiffness of left knee, not elsewhere classified  Localized swelling, mass and lump, left lower limb  Acute pain of left knee     Problem List Patient Active Problem List   Diagnosis Date Noted  . OA (osteoarthritis) of knee 08/23/2016  . Lobar pneumonia ([place]) 10/21/2015  . Cough 10/20/2015    Scot Jun, PTA 10/12/2016, 10:12 AM  [place] [address] [place], [place], [address] Phone: [phone]   Fax:  [phone]  Name: Kristin Mckinney MRN: [id number] Date of Birth: 10-28-1955

## 2016-10-13 ENCOUNTER — Encounter: Payer: Self-pay | Admitting: Physical Therapy

## 2016-10-13 ENCOUNTER — Ambulatory Visit: Payer: 59 | Admitting: Physical Therapy

## 2016-10-13 DIAGNOSIS — M25662 Stiffness of left knee, not elsewhere classified: Secondary | ICD-10-CM

## 2016-10-13 DIAGNOSIS — R262 Difficulty in walking, not elsewhere classified: Secondary | ICD-10-CM | POA: Diagnosis not present

## 2016-10-13 DIAGNOSIS — M25562 Pain in left knee: Secondary | ICD-10-CM | POA: Diagnosis not present

## 2016-10-13 DIAGNOSIS — R2242 Localized swelling, mass and lump, left lower limb: Secondary | ICD-10-CM

## 2016-10-13 MED FILL — traMADol HCL 50 MG TABS: 50 | 10 days supply | Qty: 80 | Fill #1

## 2016-10-13 MED FILL — CYCLOBENZAPRINE 10 MG TAB: 10 | 20 days supply | Qty: 60 | Fill #0

## 2016-10-13 NOTE — Therapy (Signed)
[facility] [address] [place], [place], [address] Phone: [phone]   Fax:  [phone]  Physical Therapy Treatment  Patient Details  Name: Kristin Mckinney MRN: [id number] Date of Birth: 03/27/1956 Referring Provider: Dr. Alusio  Encounter Date: 10/13/2016      PT End of Session - 10/13/16 1015    Visit Number 14   Number of Visits 24   Date for PT Re-Evaluation 11/03/16   PT Start Time 0920   PT Stop Time 1024   PT Time Calculation (min) 64 min   Activity Tolerance Patient tolerated treatment well   Behavior During Therapy [facility] for tasks assessed/performed      Past Medical History:  Diagnosis Date  . Arthritis    osteoarthritis -knees  . Pneumonia    Jan 2017- no residual problems    Past Surgical History:  Procedure Laterality Date  . CERVICAL DISC SURGERY     10 years ago- anterior  . INJECTION KNEE Right 08/23/2016   Procedure: RIGHT KNEE CORTISONE INJECTION;  Surgeon: Frank Aluisio, MD;  Location: [facility];  Service: Orthopedics;  Laterality: Right;  . KNEE SURGERY Right    "ACL reapir -Dr. collins- 5 yrs ago   . TONSILLECTOMY    . TOTAL KNEE ARTHROPLASTY Left 08/23/2016   Procedure: LEFT TOTAL KNEE ARTHROPLASTY;  Surgeon: Frank Aluisio, MD;  Location: [facility];  Service: Orthopedics;  Laterality: Left;    There were no vitals filed for this visit.      Subjective Assessment - 10/13/16 0921    Subjective "Im not sleeping good"    Currently in Pain? Yes   Pain Score 4    Pain Location Knee   Pain Orientation Left            [facility] PT Assessment - 10/13/16 0001      AROM   Left Knee Extension 4   Left Knee Flexion 110                     [facility] Adult PT Treatment/Exercise - 10/13/16 0001      Ambulation/Gait   Stairs Yes   Stairs Assistance 6: Modified independent (Device/Increase time)   Stair Management Technique One rail Right;Alternating pattern   Number of Stairs  12   Gait Comments x3      Knee/Hip Exercises: Aerobic   Elliptical I 10 R 5, 2.5 min fwd, 2.5 min back   Nustep NuStep lvl 4, 6 minutes     Knee/Hip Exercises: Machines for Strengthening   Cybex Knee Extension LLE 5lb 2x10, Eccentrics 10lb 2x10  LLE    Cybex Knee Flexion LLE 15lb 2x15   Cybex Leg Press 40lb 2x15 L4 then L3;     Knee/Hip Exercises: Standing   Lateral Step Up 2 sets;10 reps;Hand Hold: 1;Step Height: 6"  tband around LLE to resist TKE   Other Standing Knee Exercises Toe taps 8in box 2x10     Manual Therapy   Manual Therapy Passive ROM;Soft tissue mobilization   Muscle Energy Technique contract relax to increase flexion                  PT Short Term Goals - 09/17/16 1019      PT SHORT TERM GOAL #1   Title Pt will demonstrate proper recall of current HEP.   Status Achieved           PT Long Term Goals - 10/13/16 1017        PT LONG TERM GOAL #1   Title Pt will decrease swelling in left knee measurements to mathc the right knee in order to help normalize gait pattern.   Status Partially Met     PT LONG TERM GOAL #2   Title Pt will increase AROM flexion to 120 degrees and AROM extension to neutral in order to help normalize gait and functional movements.   Status On-going     PT LONG TERM GOAL #3   Title Pt will be able to ambulate 1000 feet in 6 minutes without AD and no stumbles in order to increase walking activity level.   Status On-going               Plan - 10/13/16 1016    Clinical Impression Statement Pt tolerated all interventions well and is progressing towards goals. Pt able to complete stair negotiation without little compensation. L knee remains swollen limiting ROM. Continues to require assist to keep L hip down with toe taps. Pt has increased her L knee AROM.   PT Frequency 3x / week   PT Duration 8 weeks   PT Treatment/Interventions Cryotherapy;Electrical Stimulation;ADLs/Self Care Home Management;Moist Heat;Gait  training;Stair training;Functional mobility training;Patient/family education;Neuromuscular re-education;Balance training;Therapeutic exercise;Therapeutic activities;Ultrasound;Manual techniques;Compression bandaging;Passive range of motion;Vasopneumatic Device;Energy conservation   PT Next Visit Plan Focus on the gait and ROM      Patient will benefit from skilled therapeutic intervention in order to improve the following deficits and impairments:  Decreased balance, Decreased activity tolerance, Abnormal gait, Decreased coordination, Decreased endurance, Decreased mobility, Decreased strength, Decreased range of motion, Difficulty walking, Hypomobility, Increased edema, Impaired sensation, Impaired flexibility, Impaired tone, Improper body mechanics, Pain  Visit Diagnosis: Difficulty in walking, not elsewhere classified  Stiffness of left knee, not elsewhere classified  Localized swelling, mass and lump, left lower limb  Acute pain of left knee     Problem List Patient Active Problem List   Diagnosis Date Noted  . OA (osteoarthritis) of knee 08/23/2016  . Lobar pneumonia ([facility]) 10/21/2015  . Cough 10/20/2015     G  10/13/2016, 11:00 AM  [facility] [address] [place], [place], [address] Phone: [phone]   Fax:  [phone]  Name: Kristin Mckinney MRN: [id number] Date of Birth: 02/22/1956    

## 2016-10-15 ENCOUNTER — Ambulatory Visit: Payer: 59 | Admitting: Physical Therapy

## 2016-10-18 ENCOUNTER — Ambulatory Visit: Payer: 59 | Admitting: Physical Therapy

## 2016-10-18 ENCOUNTER — Encounter: Payer: Self-pay | Admitting: Physical Therapy

## 2016-10-18 DIAGNOSIS — M25562 Pain in left knee: Secondary | ICD-10-CM

## 2016-10-18 DIAGNOSIS — M25662 Stiffness of left knee, not elsewhere classified: Secondary | ICD-10-CM

## 2016-10-18 DIAGNOSIS — R2242 Localized swelling, mass and lump, left lower limb: Secondary | ICD-10-CM

## 2016-10-18 DIAGNOSIS — R262 Difficulty in walking, not elsewhere classified: Secondary | ICD-10-CM | POA: Diagnosis not present

## 2016-10-18 NOTE — Therapy (Signed)
[place], [place], [address] Phone: [phone]   Fax:  [phone]  Physical Therapy Treatment  Patient Details  Name: Kristin Mckinney MRN: [id number] Date of Birth: 22-Aug-1956 Referring Provider: Dr. Maureen Ralphs  Encounter Date: 10/18/2016      PT End of Session - 10/18/16 1019    Visit Number 15   Number of Visits 24   Date for PT Re-Evaluation 11/03/16   PT Start Time 0927   PT Stop Time 1033   PT Time Calculation (min) 66 min   Activity Tolerance Patient tolerated treatment well   Behavior During Therapy [facility] for tasks assessed/performed      Past Medical History:  Diagnosis Date  . Arthritis    osteoarthritis -knees  . Pneumonia    Jan 2017- no residual problems    Past Surgical History:  Procedure Laterality Date  . CERVICAL DISC SURGERY     10 years ago- anterior  . INJECTION KNEE Right 08/23/2016   Procedure: RIGHT KNEE CORTISONE INJECTION;  Surgeon: Gaynelle Arabian, MD;  Location: [facility];  Service: Orthopedics;  Laterality: Right;  . KNEE SURGERY Right    "ACL reapir -Dr. Theda Sers- 5 yrs ago   . TONSILLECTOMY    . TOTAL KNEE ARTHROPLASTY Left 08/23/2016   Procedure: LEFT TOTAL KNEE ARTHROPLASTY;  Surgeon: Gaynelle Arabian, MD;  Location: [facility];  Service: Orthopedics;  Laterality: Left;    There were no vitals filed for this visit.      Subjective Assessment - 10/18/16 0928    Subjective "My extension is really good, my flexion a little sore"   Currently in Pain? No/denies   Pain Score 0-No pain                         [facility] Adult PT Treatment/Exercise - 10/18/16 0001      Knee/Hip Exercises: Aerobic   Stationary Bike 4 minutes fwd seat 4   Nustep NuStep lvl 4, 6 minutes     Knee/Hip Exercises: Machines for Strengthening   Cybex Knee Extension LLE 5lb 2x10, Eccentrics 10lb 2x10  LLE    Cybex Knee Flexion LLE 20lb 3x20; 10lb x20    Cybex Leg Press 40lb  2x15 L4 then L3;     Knee/Hip Exercises: Standing   Step Down Right;1 set;Hand Hold: 0;Hand Hold: 1;10 reps   Other Standing Knee Exercises Toe taps 8in box 3x10     Knee/Hip Exercises: Seated   Sit to Sand 1 set;10 reps;without UE support     Vasopneumatic   Number Minutes Vasopneumatic  15 minutes   Vasopnuematic Location  Knee   Vasopneumatic Pressure High   Vasopneumatic Temperature  32     Manual Therapy   Manual Therapy Passive ROM;Soft tissue mobilization   Soft tissue mobilization L quad    Passive ROM PROM of the L knee                   PT Short Term Goals - 09/17/16 1019      PT SHORT TERM GOAL #1   Title Pt will demonstrate proper recall of current HEP.   Status Achieved           PT Long Term Goals - 10/13/16 1017      PT LONG TERM GOAL #1   Title Pt will decrease swelling in left knee measurements to mathc the right knee in order to  help normalize gait pattern.   Status Partially Met     PT LONG TERM GOAL #2   Title Pt will increase AROM flexion to 120 degrees and AROM extension to neutral in order to help normalize gait and functional movements.   Status On-going     PT LONG TERM GOAL #3   Title Pt will be able to ambulate 1000 feet in 6 minutes without AD and no stumbles in order to increase walking activity level.   Status On-going               Plan - 10/18/16 1022    Clinical Impression Statement Pt reports that she has been sick for the past few days and was unable to be as active with her usual exercise routine. Today pt reports increase L knee stiffness, some pain with MT at end range of flexion and extension. Continues to give good effort with exercises, but with increase stiffness. Pt able to perform toe taps wit minium compensation from hip.   Rehab Potential Good   PT Frequency 3x / week   PT Duration 8 weeks   PT Treatment/Interventions Cryotherapy;Electrical Stimulation;ADLs/Self Care Home Management;Moist Heat;Gait  training;Stair training;Functional mobility training;Patient/family education;Neuromuscular re-education;Balance training;Therapeutic exercise;Therapeutic activities;Ultrasound;Manual techniques;Compression bandaging;Passive range of motion;Vasopneumatic Device;Energy conservation   PT Next Visit Plan Focus on the gait and ROM      Patient will benefit from skilled therapeutic intervention in order to improve the following deficits and impairments:  Decreased balance, Decreased activity tolerance, Abnormal gait, Decreased coordination, Decreased endurance, Decreased mobility, Decreased strength, Decreased range of motion, Difficulty walking, Hypomobility, Increased edema, Impaired sensation, Impaired flexibility, Impaired tone, Improper body mechanics, Pain  Visit Diagnosis: Stiffness of left knee, not elsewhere classified  Localized swelling, mass and lump, left lower limb  Acute pain of left knee  Difficulty in walking, not elsewhere classified     Problem List Patient Active Problem List   Diagnosis Date Noted  . OA (osteoarthritis) of knee 08/23/2016  . Lobar pneumonia ([place]) 10/21/2015  . Cough 10/20/2015    Scot Jun, PTA 10/18/2016, 10:32 AM  [place] [address] [place], [place], [address] Phone: [phone]   Fax:  [phone]  Name: Kristin Mckinney MRN: [id number] Date of Birth: [birth year]

## 2016-10-20 ENCOUNTER — Encounter: Payer: Self-pay | Admitting: Physical Therapy

## 2016-10-20 ENCOUNTER — Ambulatory Visit: Payer: 59 | Admitting: Physical Therapy

## 2016-10-20 DIAGNOSIS — M25662 Stiffness of left knee, not elsewhere classified: Secondary | ICD-10-CM | POA: Diagnosis not present

## 2016-10-20 DIAGNOSIS — R262 Difficulty in walking, not elsewhere classified: Secondary | ICD-10-CM | POA: Diagnosis not present

## 2016-10-20 DIAGNOSIS — R2242 Localized swelling, mass and lump, left lower limb: Secondary | ICD-10-CM

## 2016-10-20 DIAGNOSIS — M25562 Pain in left knee: Secondary | ICD-10-CM | POA: Diagnosis not present

## 2016-10-20 NOTE — Therapy (Signed)
[place], [place], [address] Phone: [phone]   Fax:  [phone]  Physical Therapy Treatment  Patient Details  Name: Kristin Mckinney MRN: [id number] Date of Birth: January 12, 1956 Referring Provider: Dr. Maureen Ralphs  Encounter Date: 10/20/2016      PT End of Session - 10/20/16 1014    Visit Number 16   Number of Visits 24   Date for PT Re-Evaluation 11/03/16   PT Start Time 0927   PT Stop Time 1029   PT Time Calculation (min) 62 min   Activity Tolerance Patient tolerated treatment well   Behavior During Therapy [facility] for tasks assessed/performed      Past Medical History:  Diagnosis Date  . Arthritis    osteoarthritis -knees  . Pneumonia    Jan 2017- no residual problems    Past Surgical History:  Procedure Laterality Date  . CERVICAL DISC SURGERY     10 years ago- anterior  . INJECTION KNEE Right 08/23/2016   Procedure: RIGHT KNEE CORTISONE INJECTION;  Surgeon: Gaynelle Arabian, MD;  Location: [facility];  Service: Orthopedics;  Laterality: Right;  . KNEE SURGERY Right    "ACL reapir -Dr. Theda Sers- 5 yrs ago   . TONSILLECTOMY    . TOTAL KNEE ARTHROPLASTY Left 08/23/2016   Procedure: LEFT TOTAL KNEE ARTHROPLASTY;  Surgeon: Gaynelle Arabian, MD;  Location: [facility];  Service: Orthopedics;  Laterality: Left;    There were no vitals filed for this visit.      Subjective Assessment - 10/20/16 0928    Subjective "Yea, just cant get the swelling to go down to get my ROM"   Currently in Pain? Yes   Pain Score 2    Pain Location Knee   Pain Orientation Right;Left                         [facility] Adult PT Treatment/Exercise - 10/20/16 0001      Knee/Hip Exercises: Stretches   Passive Hamstring Stretch Left;5 reps;10 seconds     Knee/Hip Exercises: Aerobic   Stationary Bike 4 minutes fwd seat 4   Elliptical     Nustep NuStep lvl 4, 4 minutes     Knee/Hip Exercises: Field seismologist for  Engineering geologist presses all [phone]     Knee/Hip Exercises: Seated   Long Arc Quad 2 sets;Left;10 reps   Long Arc Quad Weight 3 lbs.     Knee/Hip Exercises: Supine   Quad Sets Left;10 reps;1 set  with therapist pushing into extension   Short Arc Quad Sets 2 sets;10 reps;Strengthening;Left   Short Arc Quad Sets Limitations 3   Straight Leg Raises 2 sets;10 reps;Strengthening;Left   Straight Leg Raises Limitations 3     Modalities   Modalities Vasopneumatic     Vasopneumatic   Number Minutes Vasopneumatic  15 minutes   Vasopnuematic Location  Knee   Vasopneumatic Pressure High   Vasopneumatic Temperature  32     Manual Therapy   Manual Therapy Passive ROM   Manual therapy comments Some PROM taken to end range and hels   Passive ROM PROM of the L knee                   PT Short Term Goals - 09/17/16 1019      PT SHORT TERM GOAL #1   Title Pt will demonstrate proper recall of current HEP.   Status  Achieved           PT Long Term Goals - 10/13/16 1017      PT LONG TERM GOAL #1   Title Pt will decrease swelling in left knee measurements to mathc the right knee in order to help normalize gait pattern.   Status Partially Met     PT LONG TERM GOAL #2   Title Pt will increase AROM flexion to 120 degrees and AROM extension to neutral in order to help normalize gait and functional movements.   Status On-going     PT LONG TERM GOAL #3   Title Pt will be able to ambulate 1000 feet in 6 minutes without AD and no stumbles in order to increase walking activity level.   Status On-going               Plan - 10/20/16 1018    Clinical Impression Statement Pt enters clinic reporting increase L knee stiffness. Pt concern that her L knee extension is not as good as it was before she gotten sick. Today's interventions focused on L knee extension, pt with reports of pain during MT ant eng ranges. She can be guarded at times requiring multiple  cues to relax. Pt gives a max effort with exercises.   Rehab Potential Good   PT Frequency 3x / week   PT Duration 8 weeks   PT Treatment/Interventions Cryotherapy;Electrical Stimulation;ADLs/Self Care Home Management;Moist Heat;Gait training;Stair training;Functional mobility training;Patient/family education;Neuromuscular re-education;Balance training;Therapeutic exercise;Therapeutic activities;Ultrasound;Manual techniques;Compression bandaging;Passive range of motion;Vasopneumatic Device;Energy conservation   PT Next Visit Plan Focus on the gait and ROM      Patient will benefit from skilled therapeutic intervention in order to improve the following deficits and impairments:  Decreased balance, Decreased activity tolerance, Abnormal gait, Decreased coordination, Decreased endurance, Decreased mobility, Decreased strength, Decreased range of motion, Difficulty walking, Hypomobility, Increased edema, Impaired sensation, Impaired flexibility, Impaired tone, Improper body mechanics, Pain  Visit Diagnosis: Localized swelling, mass and lump, left lower limb  Acute pain of left knee  Difficulty in walking, not elsewhere classified  Stiffness of left knee, not elsewhere classified     Problem List Patient Active Problem List   Diagnosis Date Noted  . OA (osteoarthritis) of knee 08/23/2016  . Lobar pneumonia ([place]) 10/21/2015  . Cough 10/20/2015    Scot Jun 10/20/2016, 10:22 AM  [place] [address] [place], [place], [address] Phone: [phone]   Fax:  [phone]  Name: Kristin Mckinney MRN: [id number] Date of Birth: 1956/01/22

## 2016-10-22 ENCOUNTER — Encounter: Payer: Self-pay | Admitting: Physical Therapy

## 2016-10-22 ENCOUNTER — Ambulatory Visit: Payer: 59 | Admitting: Physical Therapy

## 2016-10-22 DIAGNOSIS — M25662 Stiffness of left knee, not elsewhere classified: Secondary | ICD-10-CM

## 2016-10-22 DIAGNOSIS — M25562 Pain in left knee: Secondary | ICD-10-CM | POA: Diagnosis not present

## 2016-10-22 DIAGNOSIS — R2242 Localized swelling, mass and lump, left lower limb: Secondary | ICD-10-CM | POA: Diagnosis not present

## 2016-10-22 DIAGNOSIS — R262 Difficulty in walking, not elsewhere classified: Secondary | ICD-10-CM

## 2016-10-22 NOTE — Therapy (Signed)
[place], [place], [address] Phone: [phone]   Fax:  [phone]  Physical Therapy Treatment  Patient Details  Name: Kristin Mckinney MRN: [id number] Date of Birth: [birth year] Referring Provider: Dr. Maureen Ralphs  Encounter Date: 10/22/2016      PT End of Session - 10/22/16 1012    Visit Number 17   Number of Visits 24   Date for PT Re-Evaluation 11/03/16   PT Start Time 0925   PT Stop Time 1020   PT Time Calculation (min) 55 min   Activity Tolerance Patient tolerated treatment well   Behavior During Therapy [facility] for tasks assessed/performed      Past Medical History:  Diagnosis Date  . Arthritis    osteoarthritis -knees  . Pneumonia    Jan 2017- no residual problems    Past Surgical History:  Procedure Laterality Date  . CERVICAL DISC SURGERY     10 years ago- anterior  . INJECTION KNEE Right 08/23/2016   Procedure: RIGHT KNEE CORTISONE INJECTION;  Surgeon: Gaynelle Arabian, MD;  [facility];  Service: Orthopedics;  Laterality: Right;  . KNEE SURGERY Right    "ACL reapir -Dr. Theda Sers- 5 yrs ago   . TONSILLECTOMY    . TOTAL KNEE ARTHROPLASTY Left 08/23/2016   Procedure: LEFT TOTAL KNEE ARTHROPLASTY;  Surgeon: Gaynelle Arabian, MD;  [facility];  Service: Orthopedics;  Laterality: Left;    There were no vitals filed for this visit.      Subjective Assessment - 10/22/16 0932    Subjective Pt reports that her right knee has been hurtingher more than the right. She is due to have a total knee surgery on the right.   Currently in Pain? Yes   Pain Score 2    Pain Location Knee   Pain Orientation Left   Multiple Pain Sites Yes   Pain Score 4   Pain Location Knee   Pain Orientation Right                         [facility] Adult PT Treatment/Exercise - 10/22/16 0001      Ambulation/Gait   Stairs Yes   Stairs Assistance 5: Supervision   Stair Management Technique  One rail Right   Number of Stairs 28     Knee/Hip Exercises: Aerobic   Stationary Bike 5 min fwd   Elliptical 3 min     Knee/Hip Exercises: Machines for Strengthening   Cybex Leg Press no weight, left leg only x20     Knee/Hip Exercises: Standing   Other Standing Knee Exercises standing HS curls with 3lb weight     Modalities   Modalities Vasopneumatic     Vasopneumatic   Number Minutes Vasopneumatic  15 minutes   Vasopnuematic Location  Knee   Vasopneumatic Pressure Medium   Vasopneumatic Temperature  34     Manual Therapy   Manual Therapy Passive ROM;Muscle Energy Technique   Passive ROM HS stretch, gastroc stretch, SKC stretch   Muscle Energy Technique contract relax knee extension                PT Education - 10/22/16 1012    Education provided Yes   Education Details Stair navigation to decrease right knee pain   Person(s) Educated Patient   Methods Explanation;Demonstration;Verbal cues   Comprehension Verbalized understanding;Returned demonstration          PT Short Term Goals -  09/17/16 1019      PT SHORT TERM GOAL #1   Title Pt will demonstrate proper recall of current HEP.   Status Achieved           PT Long Term Goals - 10/13/16 1017      PT LONG TERM GOAL #1   Title Pt will decrease swelling in left knee measurements to mathc the right knee in order to help normalize gait pattern.   Status Partially Met     PT LONG TERM GOAL #2   Title Pt will increase AROM flexion to 120 degrees and AROM extension to neutral in order to help normalize gait and functional movements.   Status On-going     PT LONG TERM GOAL #3   Title Pt will be able to ambulate 1000 feet in 6 minutes without AD and no stumbles in order to increase walking activity level.   Status On-going               Plan - 10/22/16 1013    Clinical Impression Statement Pt tolerated treatment well and was able to complete all exercises. Pt continues to report left medial  knee pain and has began to notice medial knee pain on the right knee in the past two weeks. Pt has been frustrated with the medial knee pain and stiffness however she was encouraged to keep working and that she was doing great. Progress per pt tolerance.   Rehab Potential Good   PT Frequency 3x / week   PT Duration 8 weeks   PT Treatment/Interventions Cryotherapy;Electrical Stimulation;ADLs/Self Care Home Management;Moist Heat;Gait training;Stair training;Functional mobility training;Patient/family education;Neuromuscular re-education;Balance training;Therapeutic exercise;Therapeutic activities;Ultrasound;Manual techniques;Compression bandaging;Passive range of motion;Vasopneumatic Device;Energy conservation   PT Next Visit Plan Focus on the gait and AROM, Quad contraction    Consulted and Agree with Plan of Care Patient      Patient will benefit from skilled therapeutic intervention in order to improve the following deficits and impairments:  Decreased balance, Decreased activity tolerance, Abnormal gait, Decreased coordination, Decreased endurance, Decreased mobility, Decreased strength, Decreased range of motion, Difficulty walking, Hypomobility, Increased edema, Impaired sensation, Impaired flexibility, Impaired tone, Improper body mechanics, Pain  Visit Diagnosis: Acute pain of left knee  Difficulty in walking, not elsewhere classified  Stiffness of left knee, not elsewhere classified     Problem List Patient Active Problem List   Diagnosis Date Noted  . OA (osteoarthritis) of knee 08/23/2016  . Lobar pneumonia ([place]) 10/21/2015  . Cough 10/20/2015    Toy Baker, SPT 10/22/2016, 10:16 AM  [place] [address] [place], [place], [address] Phone: [phone]   Fax:  [phone]  Name: Kristin Mckinney MRN: [id number] Date of Birth: [birth year]

## 2016-10-25 ENCOUNTER — Ambulatory Visit: Payer: 59 | Admitting: Physical Therapy

## 2016-10-25 ENCOUNTER — Encounter: Payer: Self-pay | Admitting: Physical Therapy

## 2016-10-25 DIAGNOSIS — M25562 Pain in left knee: Secondary | ICD-10-CM

## 2016-10-25 DIAGNOSIS — M25662 Stiffness of left knee, not elsewhere classified: Secondary | ICD-10-CM

## 2016-10-25 DIAGNOSIS — R262 Difficulty in walking, not elsewhere classified: Secondary | ICD-10-CM

## 2016-10-25 DIAGNOSIS — R2242 Localized swelling, mass and lump, left lower limb: Secondary | ICD-10-CM

## 2016-10-25 NOTE — Therapy (Signed)
[place] [address] [place], [place], [address] Phone: [phone]   Fax:  [phone]  Physical Therapy Treatment  Patient Details  Name: Kristin Mckinney MRN: [id number] Date of Birth: 27-Sep-1956 Referring Provider: Dr. Maureen Ralphs  Encounter Date: 10/25/2016      PT End of Session - 10/25/16 1010    Visit Number 18   Date for PT Re-Evaluation 11/03/16   PT Start Time 0917   PT Stop Time 1022   PT Time Calculation (min) 65 min   Activity Tolerance Patient tolerated treatment well   Behavior During Therapy [facility] for tasks assessed/performed      Past Medical History:  Diagnosis Date  . Arthritis    osteoarthritis -knees  . Pneumonia    Jan 2017- no residual problems    Past Surgical History:  Procedure Laterality Date  . CERVICAL DISC SURGERY     10 years ago- anterior  . INJECTION KNEE Right 08/23/2016   Procedure: RIGHT KNEE CORTISONE INJECTION;  Surgeon: Gaynelle Arabian, MD;  [facility];  Service: Orthopedics;  Laterality: Right;  . KNEE SURGERY Right    "ACL reapir -Dr. Theda Sers- 5 yrs ago   . TONSILLECTOMY    . TOTAL KNEE ARTHROPLASTY Left 08/23/2016   Procedure: LEFT TOTAL KNEE ARTHROPLASTY;  Surgeon: Gaynelle Arabian, MD;  [facility];  Service: Orthopedics;  Laterality: Left;    There were no vitals filed for this visit.      Subjective Assessment - 10/25/16 0920    Subjective Patient reports mild pain over the weekend.  She reports that she feels like she is walking better but visually she is still not bending the knee   Currently in Pain? Yes   Pain Score 3    Pain Location Knee   Pain Orientation Left;Medial   Aggravating Factors  stairs                         [facility] Adult PT Treatment/Exercise - 10/25/16 0001      Ambulation/Gait   Stairs Yes   Stairs Assistance 5: Supervision   Stair Management Technique One rail Right   Number of Stairs 28   Gait Comments  fwd and backward walking trying to break the pattern of hip hiking and not bending the knee     High Level Balance   High Level Balance Activities Backward walking   High Level Balance Comments resisted gait all directions, cues to bend the knee     Knee/Hip Exercises: Stretches   Gastroc Stretch 30 seconds;2 reps;Both     Knee/Hip Exercises: Aerobic   Stationary Bike 5 min fwd   Tread Mill 1.4 mph backwards to work on gait   Nustep NuStep lvl 4, 4 minutes     Knee/Hip Exercises: Machines for Strengthening   Cybex Leg Press no weight, left leg only x20   Other Machine Fitter presses all bands3x15     Knee/Hip Exercises: Standing   Other Standing Knee Exercises 14" toe clears and then lunge stretches     Knee/Hip Exercises: Supine   Quad Sets 2 sets;10 reps   Short Arc Quad Sets 3 sets;20 reps;Left   Short Arc Quad Sets Limitations 5#     Vasopneumatic   Number Minutes Vasopneumatic  15 minutes   Vasopnuematic Location  Knee   Vasopneumatic Pressure Medium   Vasopneumatic Temperature  34     Manual Therapy  Manual Therapy Passive ROM;Muscle Energy Technique   Passive ROM PROM knee flexion with her in supine   Muscle Energy Technique contract relax knee extension                  PT Short Term Goals - 09/17/16 1019      PT SHORT TERM GOAL #1   Title Pt will demonstrate proper recall of current HEP.   Status Achieved           PT Long Term Goals - 10/13/16 1017      PT LONG TERM GOAL #1   Title Pt will decrease swelling in left knee measurements to mathc the right knee in order to help normalize gait pattern.   Status Partially Met     PT LONG TERM GOAL #2   Title Pt will increase AROM flexion to 120 degrees and AROM extension to neutral in order to help normalize gait and functional movements.   Status On-going     PT LONG TERM GOAL #3   Title Pt will be able to ambulate 1000 feet in 6 minutes without AD and no stumbles in order to increase  walking activity level.   Status On-going               Plan - 10/25/16 1010    Clinical Impression Statement After walking backwards patient had much less hip hike, she also needs TKE exercises.  She tends to really protect and gaurd with walking and fleixon due to her c/o pain medially   PT Next Visit Plan Focus on the gait and AROM, Quad contraction    Consulted and Agree with Plan of Care Patient      Patient will benefit from skilled therapeutic intervention in order to improve the following deficits and impairments:  Decreased balance, Decreased activity tolerance, Abnormal gait, Decreased coordination, Decreased endurance, Decreased mobility, Decreased strength, Decreased range of motion, Difficulty walking, Hypomobility, Increased edema, Impaired sensation, Impaired flexibility, Impaired tone, Improper body mechanics, Pain  Visit Diagnosis: Acute pain of left knee  Difficulty in walking, not elsewhere classified  Stiffness of left knee, not elsewhere classified  Localized swelling, mass and lump, left lower limb     Problem List Patient Active Problem List   Diagnosis Date Noted  . OA (osteoarthritis) of knee 08/23/2016  . Lobar pneumonia ([place]) 10/21/2015  . Cough 10/20/2015    Sumner Boast., PT 10/25/2016, 10:12 AM  [facility] [address] [address] [place], [place], [address] Phone: [phone]   Fax:  [phone]  Name: ALEXI DORMINEY MRN: [id number] Date of Birth: 01/04/1956

## 2016-10-26 ENCOUNTER — Ambulatory Visit: Payer: 59 | Admitting: Physical Therapy

## 2016-10-26 ENCOUNTER — Encounter: Payer: Self-pay | Admitting: Physical Therapy

## 2016-10-26 DIAGNOSIS — R262 Difficulty in walking, not elsewhere classified: Secondary | ICD-10-CM

## 2016-10-26 DIAGNOSIS — M25662 Stiffness of left knee, not elsewhere classified: Secondary | ICD-10-CM

## 2016-10-26 DIAGNOSIS — R2242 Localized swelling, mass and lump, left lower limb: Secondary | ICD-10-CM | POA: Diagnosis not present

## 2016-10-26 DIAGNOSIS — M25562 Pain in left knee: Secondary | ICD-10-CM | POA: Diagnosis not present

## 2016-10-26 NOTE — Therapy (Signed)
[place], [place], [address] Phone: [phone]   Fax:  [phone]  Physical Therapy Treatment  Patient Details  Name: ANGELLA MONTAS MRN: [id number] Date of Birth: [birth year] Referring Provider: Dr. Maureen Ralphs  Encounter Date: 10/26/2016      PT End of Session - 10/26/16 1058    Visit Number 19   Number of Visits 24   Date for PT Re-Evaluation 11/03/16   PT Start Time 8250   PT Stop Time 1107   PT Time Calculation (min) 52 min   Activity Tolerance Patient tolerated treatment well   Behavior During Therapy [facility] for tasks assessed/performed      Past Medical History:  Diagnosis Date  . Arthritis    osteoarthritis -knees  . Pneumonia    Jan 2017- no residual problems    Past Surgical History:  Procedure Laterality Date  . CERVICAL DISC SURGERY     10 years ago- anterior  . INJECTION KNEE Right 08/23/2016   Procedure: RIGHT KNEE CORTISONE INJECTION;  Surgeon: Gaynelle Arabian, MD;  [facility];  Service: Orthopedics;  Laterality: Right;  . KNEE SURGERY Right    "ACL reapir -Dr. Theda Sers- 5 yrs ago   . TONSILLECTOMY    . TOTAL KNEE ARTHROPLASTY Left 08/23/2016   Procedure: LEFT TOTAL KNEE ARTHROPLASTY;  Surgeon: Gaynelle Arabian, MD;  [facility];  Service: Orthopedics;  Laterality: Left;    There were no vitals filed for this visit.      Subjective Assessment - 10/26/16 1015    Subjective "A had a little more swelling yesterday after mikes treatment."   Currently in Pain? No/denies   Pain Score 0-No pain                         [facility] Adult PT Treatment/Exercise - 10/26/16 0001      Knee/Hip Exercises: Aerobic   Stationary Bike 6 min fwd   Elliptical I 10 R 5 2 frd/ 2 rev     Knee/Hip Exercises: Machines for Strengthening   Cybex Knee Extension LLE 5lb 2x10    Cybex Knee Flexion 20lb 2x10    Cybex Leg Press no weight, left leg only x20     Knee/Hip  Exercises: Standing   Lateral Step Up Left;2 sets;15 reps;10 reps;Hand Hold: 0;Step Height: 6";Step Height: 8"   Other Standing Knee Exercises Cable pressdowns 5olb 2x10  assist needed to keep hip down   Other Standing Knee Exercises heel raises black bar 2x10      Modalities   Modalities Cryotherapy     Cryotherapy   Number Minutes Cryotherapy 10 Minutes   Cryotherapy Location Hip   Type of Cryotherapy Ice pack                  PT Short Term Goals - 09/17/16 1019      PT SHORT TERM GOAL #1   Title Pt will demonstrate proper recall of current HEP.   Status Achieved           PT Long Term Goals - 10/13/16 1017      PT LONG TERM GOAL #1   Title Pt will decrease swelling in left knee measurements to mathc the right knee in order to help normalize gait pattern.   Status Partially Met     PT LONG TERM GOAL #2   Title Pt will increase AROM flexion to 120 degrees and  AROM extension to neutral in order to help normalize gait and functional movements.   Status On-going     PT LONG TERM GOAL #3   Title Pt will be able to ambulate 1000 feet in 6 minutes without AD and no stumbles in order to increase walking activity level.   Status On-going               Plan - 10/26/16 1058    Clinical Impression Statement Pt able to complete all of today's exercises, Pt required assist  to help prevent hip hike when performing cable press downs.    Rehab Potential Good   PT Frequency 3x / week   PT Duration 8 weeks   PT Treatment/Interventions Cryotherapy;Electrical Stimulation;ADLs/Self Care Home Management;Moist Heat;Gait training;Stair training;Functional mobility training;Patient/family education;Neuromuscular re-education;Balance training;Therapeutic exercise;Therapeutic activities;Ultrasound;Manual techniques;Compression bandaging;Passive range of motion;Vasopneumatic Device;Energy conservation   PT Next Visit Plan Focus on the gait and AROM, Quad contraction        Patient will benefit from skilled therapeutic intervention in order to improve the following deficits and impairments:  Decreased balance, Decreased activity tolerance, Abnormal gait, Decreased coordination, Decreased endurance, Decreased mobility, Decreased strength, Decreased range of motion, Difficulty walking, Hypomobility, Increased edema, Impaired sensation, Impaired flexibility, Impaired tone, Improper body mechanics, Pain  Visit Diagnosis: Acute pain of left knee  Difficulty in walking, not elsewhere classified  Stiffness of left knee, not elsewhere classified  Localized swelling, mass and lump, left lower limb     Problem List Patient Active Problem List   Diagnosis Date Noted  . OA (osteoarthritis) of knee 08/23/2016  . Lobar pneumonia ([place]) 10/21/2015  . Cough 10/20/2015    Scot Jun 10/26/2016, 11:00 AM  [place] [address] [place], [place], [address] Phone: [phone]   Fax:  [phone]  Name: THEDORA RINGS MRN: [id number] Date of Birth: Nov 22, 1955

## 2016-10-27 ENCOUNTER — Ambulatory Visit: Payer: 59 | Admitting: Physical Therapy

## 2016-10-29 ENCOUNTER — Encounter: Payer: Self-pay | Admitting: Physical Therapy

## 2016-10-29 ENCOUNTER — Ambulatory Visit: Payer: 59 | Admitting: Physical Therapy

## 2016-10-29 DIAGNOSIS — R2242 Localized swelling, mass and lump, left lower limb: Secondary | ICD-10-CM

## 2016-10-29 DIAGNOSIS — M25562 Pain in left knee: Secondary | ICD-10-CM | POA: Diagnosis not present

## 2016-10-29 DIAGNOSIS — M25662 Stiffness of left knee, not elsewhere classified: Secondary | ICD-10-CM | POA: Diagnosis not present

## 2016-10-29 DIAGNOSIS — R262 Difficulty in walking, not elsewhere classified: Secondary | ICD-10-CM | POA: Diagnosis not present

## 2016-10-29 MED FILL — traMADol HCL 50 MG TABS: 50 | 10 days supply | Qty: 80 | Fill #0

## 2016-10-29 NOTE — Therapy (Signed)
[place], [place], [address] Phone: [phone]   Fax:  [phone]  Physical Therapy Treatment  Patient Details  Name: Kristin Mckinney MRN: [id number] Date of Birth: 02-Jul-1956 Referring Provider: Dr. Maureen Ralphs  Encounter Date: 10/29/2016      PT End of Session - 10/29/16 1019    Visit Number 20   Number of Visits 24   Date for PT Re-Evaluation 11/03/16   PT Start Time 0931   PT Stop Time 1035   PT Time Calculation (min) 64 min   Activity Tolerance Patient tolerated treatment well   Behavior During Therapy [facility] for tasks assessed/performed      Past Medical History:  Diagnosis Date  . Arthritis    osteoarthritis -knees  . Pneumonia    Jan 2017- no residual problems    Past Surgical History:  Procedure Laterality Date  . CERVICAL DISC SURGERY     10 years ago- anterior  . INJECTION KNEE Right 08/23/2016   Procedure: RIGHT KNEE CORTISONE INJECTION;  Surgeon: Gaynelle Arabian, MD;  Location: [facility];  Service: Orthopedics;  Laterality: Right;  . KNEE SURGERY Right    "ACL reapir -Dr. Theda Sers- 5 yrs ago   . TONSILLECTOMY    . TOTAL KNEE ARTHROPLASTY Left 08/23/2016   Procedure: LEFT TOTAL KNEE ARTHROPLASTY;  Surgeon: Gaynelle Arabian, MD;  Location: [facility];  Service: Orthopedics;  Laterality: Left;    There were no vitals filed for this visit.      Subjective Assessment - 10/29/16 0933    Subjective "Im sore and a little swollen"   Currently in Pain? Yes   Pain Score 3    Pain Location Knee                         [facility] Adult PT Treatment/Exercise - 10/29/16 0001      Knee/Hip Exercises: Aerobic   Stationary Bike  min fwd   Nustep NuStep lvl 4, 4 minutes     Knee/Hip Exercises: Machines for Strengthening   Cybex Knee Extension LLE 5lb 2x15   Cybex Knee Flexion 20lb 2x15   Cybex Leg Press no weight, left leg only 3x5      Knee/Hip Exercises: Standing   Other  Standing Knee Exercises Cable pressdowns 70lb 2x15   Other Standing Knee Exercises 10 inch toe clears 2x10     Vasopneumatic   Number Minutes Vasopneumatic  15 minutes   Vasopnuematic Location  Knee   Vasopneumatic Pressure Medium   Vasopneumatic Temperature  34     Manual Therapy   Manual Therapy Passive ROM;Joint mobilization;Soft tissue mobilization   Joint Mobilization AP tib on femur   Soft tissue mobilization L quad   Passive ROM PROM knee flexion with her in supine                  PT Short Term Goals - 09/17/16 1019      PT SHORT TERM GOAL #1   Title Pt will demonstrate proper recall of current HEP.   Status Achieved           PT Long Term Goals - 10/29/16 1019      PT LONG TERM GOAL #1   Title Pt will decrease swelling in left knee measurements to mathc the right knee in order to help normalize gait pattern.   Status Partially Met     PT LONG TERM GOAL #2  Title Pt will increase AROM flexion to 120 degrees and AROM extension to neutral in order to help normalize gait and functional movements.   Status On-going     PT LONG TERM GOAL #3   Title Pt will be able to ambulate 1000 feet in 6 minutes without AD and no stumbles in order to increase walking activity level.   Status On-going               Plan - 10/29/16 1019    Clinical Impression Statement Pt with increase soreness and stiffness because she was shoveling snow yesterday. Pt able to perform all exercises but with more pain than usual.   Rehab Potential Good   PT Frequency 3x / week   PT Duration 8 weeks   PT Treatment/Interventions Cryotherapy;Electrical Stimulation;ADLs/Self Care Home Management;Moist Heat;Gait training;Stair training;Functional mobility training;Patient/family education;Neuromuscular re-education;Balance training;Therapeutic exercise;Therapeutic activities;Ultrasound;Manual techniques;Compression bandaging;Passive range of motion;Vasopneumatic Device;Energy  conservation   PT Next Visit Plan Focus on the gait and AROM, Quad contraction       Patient will benefit from skilled therapeutic intervention in order to improve the following deficits and impairments:  Decreased balance, Decreased activity tolerance, Abnormal gait, Decreased coordination, Decreased endurance, Decreased mobility, Decreased strength, Decreased range of motion, Difficulty walking, Hypomobility, Increased edema, Impaired sensation, Impaired flexibility, Impaired tone, Improper body mechanics, Pain  Visit Diagnosis: Acute pain of left knee  Difficulty in walking, not elsewhere classified  Stiffness of left knee, not elsewhere classified  Localized swelling, mass and lump, left lower limb     Problem List Patient Active Problem List   Diagnosis Date Noted  . OA (osteoarthritis) of knee 08/23/2016  . Lobar pneumonia ([place]) 10/21/2015  . Cough 10/20/2015    Scot Jun 10/29/2016, 10:22 AM  [place] [address] [place], [place], [address] Phone: [phone]   Fax:  [phone]  Name: Kristin Mckinney MRN: [id number] Date of Birth: 01-22-1956

## 2016-11-01 ENCOUNTER — Encounter: Payer: Self-pay | Admitting: Physical Therapy

## 2016-11-01 ENCOUNTER — Ambulatory Visit: Payer: 59 | Admitting: Physical Therapy

## 2016-11-01 DIAGNOSIS — M25562 Pain in left knee: Secondary | ICD-10-CM | POA: Diagnosis not present

## 2016-11-01 DIAGNOSIS — R2242 Localized swelling, mass and lump, left lower limb: Secondary | ICD-10-CM | POA: Diagnosis not present

## 2016-11-01 DIAGNOSIS — R262 Difficulty in walking, not elsewhere classified: Secondary | ICD-10-CM

## 2016-11-01 DIAGNOSIS — M25662 Stiffness of left knee, not elsewhere classified: Secondary | ICD-10-CM | POA: Diagnosis not present

## 2016-11-01 NOTE — Therapy (Signed)
[place], [place], [address] Phone: [phone]   Fax:  [phone]  Physical Therapy Treatment  Patient Details  Name: RMANI KAPUSTA MRN: [id number] Date of Birth: 02/09/1956 Referring Provider: Dr. Maureen Ralphs  Encounter Date: 11/01/2016      PT End of Session - 11/01/16 1005    Visit Number 21   Date for PT Re-Evaluation 11/03/16   PT Start Time 0920   PT Stop Time 1019   PT Time Calculation (min) 59 min   Activity Tolerance Patient tolerated treatment well   Behavior During Therapy [facility] for tasks assessed/performed      Past Medical History:  Diagnosis Date  . Arthritis    osteoarthritis -knees  . Pneumonia    Jan 2017- no residual problems    Past Surgical History:  Procedure Laterality Date  . CERVICAL DISC SURGERY     10 years ago- anterior  . INJECTION KNEE Right 08/23/2016   Procedure: RIGHT KNEE CORTISONE INJECTION;  Surgeon: Gaynelle Arabian, MD;  Location: [facility];  Service: Orthopedics;  Laterality: Right;  . KNEE SURGERY Right    "ACL reapir -Dr. Theda Sers- 5 yrs ago   . TONSILLECTOMY    . TOTAL KNEE ARTHROPLASTY Left 08/23/2016   Procedure: LEFT TOTAL KNEE ARTHROPLASTY;  Surgeon: Gaynelle Arabian, MD;  [facility];  Service: Orthopedics;  Laterality: Left;    There were no vitals filed for this visit.      Subjective Assessment - 11/01/16 0923    Subjective "Its a little swollen, maybe a 2 in pain, I know its there"   Currently in Pain? Yes   Pain Score 2    Pain Location Knee   Pain Orientation Left            [facility] PT Assessment - 11/01/16 0001      AROM   Left Knee Extension 2   Left Knee Flexion 106                     [facility] Adult PT Treatment/Exercise - 11/01/16 0001      Knee/Hip Exercises: Aerobic   Stationary Bike  3 min fwd   Tread Mill 1.2 mph backwards to work on gait   Nustep NuStep lvl 4, 5 minutes     Knee/Hip Exercises:  Machines for Strengthening   Cybex Knee Extension LLE 10lb 2x10   Cybex Knee Flexion LLE 20lb 2x15   Cybex Leg Press 40LB machine L3 2x10; LLE only no weight 2x5     Knee/Hip Exercises: Standing   Other Standing Knee Exercises controlled desents 6in LLE 2x15      Vasopneumatic   Number Minutes Vasopneumatic  15 minutes   Vasopnuematic Location  Knee   Vasopneumatic Pressure Medium   Vasopneumatic Temperature  34     Manual Therapy   Manual Therapy Passive ROM;Soft tissue mobilization   Soft tissue mobilization L quad   Passive ROM PROM knee flexion sitting                   PT Short Term Goals - 09/17/16 1019      PT SHORT TERM GOAL #1   Title Pt will demonstrate proper recall of current HEP.   Status Achieved           PT Long Term Goals - 11/01/16 1008      PT LONG TERM GOAL #1   Title Pt will  decrease swelling in left knee measurements to mathc the right knee in order to help normalize gait pattern.   Status Partially Met     PT LONG TERM GOAL #2   Status On-going     PT LONG TERM GOAL #3   Status On-going               Plan - 11/01/16 1005    Clinical Impression Statement Pt with a little increase in L knee AROM with extension. Completed all of today's exercises well and gives good effort. Pt still has some swelling that could be limiting her ROM.   Rehab Potential Good   PT Frequency 3x / week   PT Duration 8 weeks   PT Treatment/Interventions Cryotherapy;Electrical Stimulation;ADLs/Self Care Home Management;Moist Heat;Gait training;Stair training;Functional mobility training;Patient/family education;Neuromuscular re-education;Balance training;Therapeutic exercise;Therapeutic activities;Ultrasound;Manual techniques;Compression bandaging;Passive range of motion;Vasopneumatic Device;Energy conservation   PT Next Visit Plan Pt reports that she see MD tomorrow      Patient will benefit from skilled therapeutic intervention in order to improve  the following deficits and impairments:  Decreased balance, Decreased activity tolerance, Abnormal gait, Decreased coordination, Decreased endurance, Decreased mobility, Decreased strength, Decreased range of motion, Difficulty walking, Hypomobility, Increased edema, Impaired sensation, Impaired flexibility, Impaired tone, Improper body mechanics, Pain  Visit Diagnosis: Difficulty in walking, not elsewhere classified  Stiffness of left knee, not elsewhere classified  Acute pain of left knee  Localized swelling, mass and lump, left lower limb     Problem List Patient Active Problem List   Diagnosis Date Noted  . OA (osteoarthritis) of knee 08/23/2016  . Lobar pneumonia ([place]) 10/21/2015  . Cough 10/20/2015    Scot Jun, PTA 11/01/2016, 10:10 AM  [place] [address] [place], [place], [address] Phone: [phone]   Fax:  [phone]  Name: JORDANNA HENDRIE MRN: [id number] Date of Birth: 01/07/1956

## 2016-11-03 ENCOUNTER — Encounter: Payer: Self-pay | Admitting: Physical Therapy

## 2016-11-03 ENCOUNTER — Ambulatory Visit: Payer: 59 | Admitting: Physical Therapy

## 2016-11-03 DIAGNOSIS — R2242 Localized swelling, mass and lump, left lower limb: Secondary | ICD-10-CM | POA: Diagnosis not present

## 2016-11-03 DIAGNOSIS — R262 Difficulty in walking, not elsewhere classified: Secondary | ICD-10-CM | POA: Diagnosis not present

## 2016-11-03 DIAGNOSIS — M25662 Stiffness of left knee, not elsewhere classified: Secondary | ICD-10-CM | POA: Diagnosis not present

## 2016-11-03 DIAGNOSIS — M25562 Pain in left knee: Secondary | ICD-10-CM

## 2016-11-03 NOTE — Therapy (Signed)
[place] [address] [place], [place], [address] Phone: [phone]   Fax:  [phone]  Physical Therapy Treatment  Patient Details  Name: Kristin Mckinney MRN: [id number] Date of Birth: [birth year] Referring Provider: Dr. Maureen Ralphs  Encounter Date: 11/03/2016      PT End of Session - 11/03/16 1012    Visit Number 22   Number of Visits 24   Date for PT Re-Evaluation 11/07/16   PT Start Time 0926   PT Stop Time 1020   PT Time Calculation (min) 54 min   Activity Tolerance Patient tolerated treatment well   Behavior During Therapy [facility] for tasks assessed/performed      Past Medical History:  Diagnosis Date  . Arthritis    osteoarthritis -knees  . Pneumonia    Jan 2017- no residual problems    Past Surgical History:  Procedure Laterality Date  . CERVICAL DISC SURGERY     10 years ago- anterior  . INJECTION KNEE Right 08/23/2016   Procedure: RIGHT KNEE CORTISONE INJECTION;  Surgeon: Gaynelle Arabian, MD;  [facility];  Service: Orthopedics;  Laterality: Right;  . KNEE SURGERY Right    "ACL reapir -Dr. Theda Sers- 5 yrs ago   . TONSILLECTOMY    . TOTAL KNEE ARTHROPLASTY Left 08/23/2016   Procedure: LEFT TOTAL KNEE ARTHROPLASTY;  Surgeon: Gaynelle Arabian, MD;  [facility]: [facility];  Service: Orthopedics;  Laterality: Left;    There were no vitals filed for this visit.      Subjective Assessment - 11/03/16 0923    Subjective "Just swollen, no pain right now"   Currently in Pain? No/denies   Pain Score 0-No pain                         [facility] Adult PT Treatment/Exercise - 11/03/16 0001      Ambulation/Gait   Stairs Yes   Stairs Assistance 6: Modified independent (Device/Increase time)   Stair Management Technique One rail Right   Gait Comments 3 flights      Knee/Hip Exercises: Aerobic   Stationary Bike  4 min fwd seat level 4 then 3   Elliptical I 10 R 5 2 frd/ 2 rev     Knee/Hip  Exercises: Machines for Strengthening   Cybex Leg Press 40LB machine L3 2x10; SS with heel raises x20; LLE only 20lb L3 2x10     Knee/Hip Exercises: Standing   Lateral Step Up Left;2 sets;10 reps;Hand Hold: 0;Step Height: 6"  10lb; SS with LE deaslifts to 6in box 5lb x10   Walking with Sports Cord 40lb 4 way x5     Modalities   Modalities Cryotherapy     Cryotherapy   Number Minutes Cryotherapy 10 Minutes   Cryotherapy Location Knee   Type of Cryotherapy Ice pack                  PT Short Term Goals - 09/17/16 1019      PT SHORT TERM GOAL #1   Title Pt will demonstrate proper recall of current HEP.   Status Achieved           PT Long Term Goals - 11/03/16 1015      PT LONG TERM GOAL #1   Title Pt will decrease swelling in left knee measurements to mathc the right knee in order to help normalize gait pattern.   Status Partially Met     PT LONG  TERM GOAL #2   Title Pt will increase AROM flexion to 120 degrees and AROM extension to neutral in order to help normalize gait and functional movements.   Status On-going     PT LONG TERM GOAL #3   Title Pt will be able to ambulate 1000 feet in 6 minutes without AD and no stumbles in order to increase walking activity level.   Status On-going               Plan - 11/03/16 1016    Clinical Impression Statement Pt reports that her MD was pleased with her progress. Pt tolerated a more progressive treatment well. She does have some hip compensation with SL leg press and step ups. She reports no increase in pain. Pt does require increase time to negotiate stairs.   Rehab Potential Good   PT Frequency 3x / week   PT Duration 8 weeks   PT Treatment/Interventions Cryotherapy;Electrical Stimulation;ADLs/Self Care Home Management;Moist Heat;Gait training;Stair training;Functional mobility training;Patient/family education;Neuromuscular re-education;Balance training;Therapeutic exercise;Therapeutic  activities;Ultrasound;Manual techniques;Compression bandaging;Passive range of motion;Vasopneumatic Device;Energy conservation   PT Next Visit Plan Progress as tolerated to increase function      Patient will benefit from skilled therapeutic intervention in order to improve the following deficits and impairments:  Decreased balance, Decreased activity tolerance, Abnormal gait, Decreased coordination, Decreased endurance, Decreased mobility, Decreased strength, Decreased range of motion, Difficulty walking, Hypomobility, Increased edema, Impaired sensation, Impaired flexibility, Impaired tone, Improper body mechanics, Pain  Visit Diagnosis: Difficulty in walking, not elsewhere classified  Stiffness of left knee, not elsewhere classified  Acute pain of left knee  Localized swelling, mass and lump, left lower limb     Problem List Patient Active Problem List   Diagnosis Date Noted  . OA (osteoarthritis) of knee 08/23/2016  . Lobar pneumonia ([place]) 10/21/2015  . Cough 10/20/2015    Scot Jun, PTA 11/03/2016, 10:22 AM  [place] [address] [facility] [place], [place], [address] Phone: [phone]   Fax:  [phone]  Name: Kristin Mckinney MRN: [id number] Date of Birth: October 05, 1956

## 2016-11-05 ENCOUNTER — Ambulatory Visit: Payer: 59 | Admitting: Physical Therapy

## 2016-11-05 ENCOUNTER — Encounter: Payer: Self-pay | Admitting: Physical Therapy

## 2016-11-05 DIAGNOSIS — M25662 Stiffness of left knee, not elsewhere classified: Secondary | ICD-10-CM | POA: Diagnosis not present

## 2016-11-05 DIAGNOSIS — M25562 Pain in left knee: Secondary | ICD-10-CM

## 2016-11-05 DIAGNOSIS — R2242 Localized swelling, mass and lump, left lower limb: Secondary | ICD-10-CM | POA: Diagnosis not present

## 2016-11-05 DIAGNOSIS — R262 Difficulty in walking, not elsewhere classified: Secondary | ICD-10-CM

## 2016-11-05 NOTE — Therapy (Signed)
[place] [address] [place], [place], [address] Phone: [phone]   Fax:  [phone]  Physical Therapy Treatment  Patient Details  Name: Kristin Mckinney MRN: [id number] Date of Birth: [birth year] Referring Provider: Dr. Maureen Ralphs  Encounter Date: 11/05/2016      PT End of Session - 11/05/16 1018    Visit Number 23   Date for PT Re-Evaluation 11/07/16   PT Start Time 0926   PT Stop Time 1030   PT Time Calculation (min) 64 min      Past Medical History:  Diagnosis Date  . Arthritis    osteoarthritis -knees  . Pneumonia    Jan 2017- no residual problems    Past Surgical History:  Procedure Laterality Date  . CERVICAL DISC SURGERY     10 years ago- anterior  . INJECTION KNEE Right 08/23/2016   Procedure: RIGHT KNEE CORTISONE INJECTION;  Surgeon: Gaynelle Arabian, MD;  Location: [facility];  Service: Orthopedics;  Laterality: Right;  . KNEE SURGERY Right    "ACL reapir -Dr. Theda Sers- 5 yrs ago   . TONSILLECTOMY    . TOTAL KNEE ARTHROPLASTY Left 08/23/2016   Procedure: LEFT TOTAL KNEE ARTHROPLASTY;  Surgeon: Gaynelle Arabian, MD;  [facility]: [facility];  Service: Orthopedics;  Laterality: Left;    There were no vitals filed for this visit.      Subjective Assessment - 11/05/16 0922    Subjective Im doing good not having any pain, I took some Advil before I came   Currently in Pain? No/denies   Pain Score 0-No pain                         [facility] Adult PT Treatment/Exercise - 11/05/16 0001      Knee/Hip Exercises: Aerobic   Stationary Bike  4 min fwd seat level 3   Elliptical I 10 R 5 2 frd/ 2 rev     Knee/Hip Exercises: Machines for Strengthening   Cybex Knee Extension LLE 5lb x15; 10lb 2x10   Cybex Knee Flexion LLE 20lb 2x15   Cybex Leg Press 40LB machine L3 2x10; SS with heel raises x20      Knee/Hip Exercises: Standing   Forward Step Up 2 sets;10 reps;Hand Hold: 0;Step Height: 8"  6lb  bembbells   Walking with Sports Cord 50lb 4 way x5   Other Standing Knee Exercises Single leg squat from UBE x10, x5                  PT Short Term Goals - 09/17/16 1019      PT SHORT TERM GOAL #1   Title Pt will demonstrate proper recall of current HEP.   Status Achieved           PT Long Term Goals - 11/03/16 1015      PT LONG TERM GOAL #1   Title Pt will decrease swelling in left knee measurements to mathc the right knee in order to help normalize gait pattern.   Status Partially Met     PT LONG TERM GOAL #2   Title Pt will increase AROM flexion to 120 degrees and AROM extension to neutral in order to help normalize gait and functional movements.   Status On-going     PT LONG TERM GOAL #3   Title Pt will be able to ambulate 1000 feet in 6 minutes without AD and no stumbles in order to increase  walking activity level.   Status On-going               Plan - 11/05/16 1019    Clinical Impression Statement Pt continues to do well, progressed to weighted step ups focusing on pushing down through LLE. Pt give good effort with all interventions and reports no increase in pain.   Rehab Potential Good   PT Frequency 3x / week   PT Duration 8 weeks   PT Treatment/Interventions Cryotherapy;Electrical Stimulation;ADLs/Self Care Home Management;Moist Heat;Gait training;Stair training;Functional mobility training;Patient/family education;Neuromuscular re-education;Balance training;Therapeutic exercise;Therapeutic activities;Ultrasound;Manual techniques;Compression bandaging;Passive range of motion;Vasopneumatic Device;Energy conservation   PT Next Visit Plan Progress as tolerated to increase function      Patient will benefit from skilled therapeutic intervention in order to improve the following deficits and impairments:  Decreased balance, Decreased activity tolerance, Abnormal gait, Decreased coordination, Decreased endurance, Decreased mobility, Decreased strength,  Decreased range of motion, Difficulty walking, Hypomobility, Increased edema, Impaired sensation, Impaired flexibility, Impaired tone, Improper body mechanics, Pain  Visit Diagnosis: Acute pain of left knee  Difficulty in walking, not elsewhere classified  Localized swelling, mass and lump, left lower limb  Stiffness of left knee, not elsewhere classified     Problem List Patient Active Problem List   Diagnosis Date Noted  . OA (osteoarthritis) of knee 08/23/2016  . Lobar pneumonia ([place]) 10/21/2015  . Cough 10/20/2015    Scot Jun 11/05/2016, 10:23 AM  [place] [address] [place], [place], [address] Phone: [phone]   Fax:  [phone]  Name: AGATA LUCENTE MRN: [id number] Date of Birth: April 18, 1956

## 2016-11-08 ENCOUNTER — Ambulatory Visit: Payer: 59 | Admitting: Physical Therapy

## 2016-11-08 ENCOUNTER — Encounter: Payer: Self-pay | Admitting: Physical Therapy

## 2016-11-08 DIAGNOSIS — M25562 Pain in left knee: Secondary | ICD-10-CM

## 2016-11-08 DIAGNOSIS — R2242 Localized swelling, mass and lump, left lower limb: Secondary | ICD-10-CM | POA: Diagnosis not present

## 2016-11-08 DIAGNOSIS — R262 Difficulty in walking, not elsewhere classified: Secondary | ICD-10-CM

## 2016-11-08 DIAGNOSIS — M25662 Stiffness of left knee, not elsewhere classified: Secondary | ICD-10-CM | POA: Diagnosis not present

## 2016-11-08 NOTE — Addendum Note (Signed)
Addended by: [name], Kimberle Stanfill W on: 11/08/2016 04:39 PM   Modules accepted: Orders

## 2016-11-08 NOTE — Therapy (Signed)
[name], [place], [address] Phone: [phone]   Fax:  [phone]  Physical Therapy Treatment  Patient Details  Name: Kristin Mckinney MRN: [id number] Date of Birth: 05-Jul-1956 Referring Provider: Dr. Maureen Ralphs  Encounter Date: 11/08/2016      PT End of Session - 11/08/16 1110    Visit Number 24   Number of Visits 24   Date for PT Re-Evaluation 11/07/16   PT Start Time 6063   PT Stop Time 1119   PT Time Calculation (min) 65 min   Activity Tolerance Patient tolerated treatment well   Behavior During Therapy [facility] for tasks assessed/performed      Past Medical History:  Diagnosis Date  . Arthritis    osteoarthritis -knees  . Pneumonia    Jan 2017- no residual problems    Past Surgical History:  Procedure Laterality Date  . CERVICAL DISC SURGERY     10 years ago- anterior  . INJECTION KNEE Right 08/23/2016   Procedure: RIGHT KNEE CORTISONE INJECTION;  Surgeon: Gaynelle Arabian, MD;  Location: [facility];  Service: Orthopedics;  Laterality: Right;  . KNEE SURGERY Right    "ACL reapir -Dr. Theda Sers- 5 yrs ago   . TONSILLECTOMY    . TOTAL KNEE ARTHROPLASTY Left 08/23/2016   Procedure: LEFT TOTAL KNEE ARTHROPLASTY;  Surgeon: Gaynelle Arabian, MD;  Location: [facility];  Service: Orthopedics;  Laterality: Left;    There were no vitals filed for this visit.      Subjective Assessment - 11/08/16 1016    Subjective pt reports that she has some L hip bursitis that started Saturday morning. Pt reports doing some walking after last session with no issues. Since having L hip pain Saturday morning it has calm down some. Pt reports that she is still having trouble sleeping due to discomfort   Currently in Pain? No/denies   Pain Score 0-No pain                         [facility] Adult PT Treatment/Exercise - 11/08/16 0001      Ambulation/Gait   Gait Comments Walked with pt around vuilding >57f.  Pt with some stiffness is LLE, but able to control pace going up and down slopes.     Knee/Hip Exercises: Stretches   Passive Hamstring Stretch 3 reps;10 seconds   Piriformis Stretch 3 reps;10 seconds   Other Knee/Hip Stretches single K2C      Knee/Hip Exercises: Aerobic   Stationary Bike  4 min fwd seat level 3   Nustep NuStep lvl 4, 6 minutes     Knee/Hip Exercises: Machines for Strengthening   Cybex Knee Extension LLE 10lb 2x10    Cybex Knee Flexion LLE 20lb 2x15   Other Machine Fitter presses 2 black one blue  2x15     Knee/Hip Exercises: Standing   Walking with Sports Cord 40lb 3 way x5 with frd step up, with lat step up     Knee/Hip Exercises: Seated   Sit to Sand 10 reps;2 sets;without UE support  holding yellow ball     Vasopneumatic   Number Minutes Vasopneumatic  15 minutes   Vasopnuematic Location  Knee   Vasopneumatic Pressure High   Vasopneumatic Temperature  32     Manual Therapy   Manual Therapy Passive ROM   Passive ROM L hip PROM  PT Short Term Goals - 09/17/16 1019      PT SHORT TERM GOAL #1   Title Pt will demonstrate proper recall of current HEP.   Status Achieved           PT Long Term Goals - 11/03/16 1015      PT LONG TERM GOAL #1   Title Pt will decrease swelling in left knee measurements to mathc the right knee in order to help normalize gait pattern.   Status Partially Met     PT LONG TERM GOAL #2   Title Pt will increase AROM flexion to 120 degrees and AROM extension to neutral in order to help normalize gait and functional movements.   Status On-going     PT LONG TERM GOAL #3   Title Pt will be able to ambulate 1000 feet in 6 minutes without AD and no stumbles in order to increase walking activity level.   Status On-going               Plan - 11/08/16 1117    Clinical Impression Statement Despite of c/o L hip pain over the weekend, she was able to complete all of today's exercises. Pt with some  L hip pain with palpation in greater trochanter area. Pt with some instability with resisted walking with step ups. Difficulty today with single leg extension due fatigue and weakness.   Rehab Potential Good   PT Frequency 3x / week   PT Duration 8 weeks   PT Treatment/Interventions Cryotherapy;Electrical Stimulation;ADLs/Self Care Home Management;Moist Heat;Gait training;Stair training;Functional mobility training;Patient/family education;Neuromuscular re-education;Balance training;Therapeutic exercise;Therapeutic activities;Ultrasound;Manual techniques;Compression bandaging;Passive range of motion;Vasopneumatic Device;Energy conservation   PT Next Visit Plan Progress as tolerated to increase function      Patient will benefit from skilled therapeutic intervention in order to improve the following deficits and impairments:  Decreased balance, Decreased activity tolerance, Abnormal gait, Decreased coordination, Decreased endurance, Decreased mobility, Decreased strength, Decreased range of motion, Difficulty walking, Hypomobility, Increased edema, Impaired sensation, Impaired flexibility, Impaired tone, Improper body mechanics, Pain  Visit Diagnosis: Acute pain of left knee  Difficulty in walking, not elsewhere classified  Localized swelling, mass and lump, left lower limb  Stiffness of left knee, not elsewhere classified     Problem List Patient Active Problem List   Diagnosis Date Noted  . OA (osteoarthritis) of knee 08/23/2016  . Lobar pneumonia ([place]) 10/21/2015  . Cough 10/20/2015    Scot Jun, PTA 11/08/2016, 11:25 AM  [place] [address] [place], [place], [address] Phone: [phone]   Fax:  [phone]  Name: Kristin Mckinney MRN: [id number] Date of Birth: [birth year]

## 2016-11-10 ENCOUNTER — Ambulatory Visit: Payer: 59 | Admitting: Physical Therapy

## 2016-11-10 ENCOUNTER — Encounter: Payer: Self-pay | Admitting: Physical Therapy

## 2016-11-10 DIAGNOSIS — M25562 Pain in left knee: Secondary | ICD-10-CM

## 2016-11-10 DIAGNOSIS — R262 Difficulty in walking, not elsewhere classified: Secondary | ICD-10-CM

## 2016-11-10 DIAGNOSIS — R2242 Localized swelling, mass and lump, left lower limb: Secondary | ICD-10-CM | POA: Diagnosis not present

## 2016-11-10 DIAGNOSIS — M25662 Stiffness of left knee, not elsewhere classified: Secondary | ICD-10-CM

## 2016-11-10 NOTE — Therapy (Signed)
[facility], [place], [address] Phone: [phone]   Fax:  [phone]  Physical Therapy Treatment  Patient Details  Name: Kristin Mckinney MRN: [id number] Date of Birth: [birth year] Referring Provider: Dr. Maureen Ralphs  Encounter Date: 11/10/2016      PT End of Session - 11/10/16 1015    Visit Number 25   Date for PT Re-Evaluation 12/08/16   PT Start Time 0926   PT Stop Time 1028   PT Time Calculation (min) 62 min   Activity Tolerance Patient tolerated treatment well   Behavior During Therapy [facility] for tasks assessed/performed      Past Medical History:  Diagnosis Date  . Arthritis    osteoarthritis -knees  . Pneumonia    Jan 2017- no residual problems    Past Surgical History:  Procedure Laterality Date  . CERVICAL DISC SURGERY     10 years ago- anterior  . INJECTION KNEE Right 08/23/2016   Procedure: RIGHT KNEE CORTISONE INJECTION;  Surgeon: Gaynelle Arabian, MD;  Location: [facility];  Service: Orthopedics;  Laterality: Right;  . KNEE SURGERY Right    "ACL reapir -Dr. Theda Sers- 5 yrs ago   . TONSILLECTOMY    . TOTAL KNEE ARTHROPLASTY Left 08/23/2016   Procedure: LEFT TOTAL KNEE ARTHROPLASTY;  Surgeon: Gaynelle Arabian, MD;  Location: [facility];  Service: Orthopedics;  Laterality: Left;    There were no vitals filed for this visit.      Subjective Assessment - 11/10/16 0927    Subjective "Hips better knee dose not hurt,  worked out yesterday twice"   Currently in Pain? No/denies   Pain Score 0-No pain                         [facility] Adult PT Treatment/Exercise - 11/10/16 0001      Knee/Hip Exercises: Aerobic   Elliptical I 10 R 5 2 frd/ 2 rev   Nustep NuStep lvl 4, 6 minutes     Knee/Hip Exercises: Machines for Strengthening   Cybex Knee Extension LLE 10lb 2x10    Cybex Knee Flexion LLE 20lb 2x15   Cybex Leg Press 50lb machine L3 3x10; SS with heel raises x20    Other Machine  Fitter presses 2 black one blue  2x15     Knee/Hip Exercises: Standing   Forward Step Up 2 sets;10 reps;Hand Hold: 0;Step Height: 8"  7lb dumbbells    Walking with Sports Cord 70lb front and baxk x5, 50lb side steppinf x5      Knee/Hip Exercises: Seated   Sit to Sand 10 reps;2 sets;without UE support  holding blue ball      Vasopneumatic   Number Minutes Vasopneumatic  15 minutes   Vasopnuematic Location  Knee   Vasopneumatic Pressure High   Vasopneumatic Temperature  32                  PT Short Term Goals - 09/17/16 1019      PT SHORT TERM GOAL #1   Title Pt will demonstrate proper recall of current HEP.   Status Achieved           PT Long Term Goals - 11/03/16 1015      PT LONG TERM GOAL #1   Title Pt will decrease swelling in left knee measurements to mathc the right knee in order to help normalize gait pattern.   Status Partially Met  PT LONG TERM GOAL #2   Title Pt will increase AROM flexion to 120 degrees and AROM extension to neutral in order to help normalize gait and functional movements.   Status On-going     PT LONG TERM GOAL #3   Title Pt will be able to ambulate 1000 feet in 6 minutes without AD and no stumbles in order to increase walking activity level.   Status On-going               Plan - 11/10/16 1016    Clinical Impression Statement Pt tolerated all exercises well no reports of increase pain. , D/C next visit   Rehab Potential Good   PT Frequency 3x / week   PT Duration 8 weeks   PT Treatment/Interventions Cryotherapy;Electrical Stimulation;ADLs/Self Care Home Management;Moist Heat;Gait training;Stair training;Functional mobility training;Patient/family education;Neuromuscular re-education;Balance training;Therapeutic exercise;Therapeutic activities;Ultrasound;Manual techniques;Compression bandaging;Passive range of motion;Vasopneumatic Device;Energy conservation   PT Next Visit Plan D/C next visit      Patient will  benefit from skilled therapeutic intervention in order to improve the following deficits and impairments:  Decreased balance, Decreased activity tolerance, Abnormal gait, Decreased coordination, Decreased endurance, Decreased mobility, Decreased strength, Decreased range of motion, Difficulty walking, Hypomobility, Increased edema, Impaired sensation, Impaired flexibility, Impaired tone, Improper body mechanics, Pain  Visit Diagnosis: Acute pain of left knee  Localized swelling, mass and lump, left lower limb  Stiffness of left knee, not elsewhere classified  Difficulty in walking, not elsewhere classified     Problem List Patient Active Problem List   Diagnosis Date Noted  . OA (osteoarthritis) of knee 08/23/2016  . Lobar pneumonia ([place]) 10/21/2015  . Cough 10/20/2015    Scot Jun, PTA 11/10/2016, 10:17 AM  [place] [address] [place], [place], [address] Phone: [phone]   Fax:  [phone]  Name: Kristin Mckinney MRN: [id number] Date of Birth: 04/07/1956

## 2016-11-12 ENCOUNTER — Ambulatory Visit: Payer: 59 | Admitting: Physical Therapy

## 2016-11-15 ENCOUNTER — Ambulatory Visit: Payer: 59 | Attending: Orthopedic Surgery | Admitting: Physical Therapy

## 2016-11-15 ENCOUNTER — Encounter: Payer: Self-pay | Admitting: Physical Therapy

## 2016-11-15 DIAGNOSIS — M25562 Pain in left knee: Secondary | ICD-10-CM | POA: Diagnosis not present

## 2016-11-15 DIAGNOSIS — R262 Difficulty in walking, not elsewhere classified: Secondary | ICD-10-CM | POA: Diagnosis not present

## 2016-11-15 DIAGNOSIS — R2242 Localized swelling, mass and lump, left lower limb: Secondary | ICD-10-CM | POA: Diagnosis not present

## 2016-11-15 NOTE — Therapy (Signed)
[facility] [place], [place], [address] Phone: [phone]   Fax:  [phone]  Physical Therapy Treatment  Patient Details  Name: Kristin Mckinney MRN: [id number] Date of Birth: 20-Dec-1955 Referring Provider: Dr. Maureen Ralphs  Encounter Date: 11/15/2016      PT End of Session - 11/15/16 1407    Visit Number 26   Date for PT Re-Evaluation 12/08/16   PT Start Time 1315   PT Stop Time 1415   PT Time Calculation (min) 60 min   Activity Tolerance Patient tolerated treatment well   Behavior During Therapy [facility] for tasks assessed/performed      Past Medical History:  Diagnosis Date  . Arthritis    osteoarthritis -knees  . Pneumonia    Jan 2017- no residual problems    Past Surgical History:  Procedure Laterality Date  . CERVICAL DISC SURGERY     10 years ago- anterior  . INJECTION KNEE Right 08/23/2016   Procedure: RIGHT KNEE CORTISONE INJECTION;  Surgeon: Gaynelle Arabian, MD;  Location: [facility];  Service: Orthopedics;  Laterality: Right;  . KNEE SURGERY Right    "ACL reapir -Dr. Theda Sers- 5 yrs ago   . TONSILLECTOMY    . TOTAL KNEE ARTHROPLASTY Left 08/23/2016   Procedure: LEFT TOTAL KNEE ARTHROPLASTY;  Surgeon: Gaynelle Arabian, MD;  Location: [facility];  Service: Orthopedics;  Laterality: Left;    There were no vitals filed for this visit.      Subjective Assessment - 11/15/16 1312    Subjective "Its just a littel stiff with some swelling onder the knee cap, but "   Currently in Pain? No/denies   Pain Score 0-No pain            [facility] PT Assessment - 11/15/16 0001      AROM   Left Knee Extension 0   Left Knee Flexion 107                     [facility] Adult PT Treatment/Exercise - 11/15/16 0001      Knee/Hip Exercises: Aerobic   Elliptical I 10 R 5 2 frd/ 2 rev   Nustep NuStep lvl 5, 6 minutes     Knee/Hip Exercises: Machines for Strengthening   Cybex Knee Extension LLE 10lb 2x10    Cybex Knee Flexion LLE 20lb 2x15   Cybex Leg Press 50lb machine L3 then L2 2x10; SS with heel raises x20      Knee/Hip Exercises: Standing   Walking with Sports Cord 70lb front and baxk x5, 50lb side steppinf x5      Knee/Hip Exercises: Seated   Sit to Sand 15 reps;2 sets;without UE support  Holding  8lb ball      Vasopneumatic   Number Minutes Vasopneumatic  15 minutes   Vasopnuematic Location  Knee   Vasopneumatic Pressure High   Vasopneumatic Temperature  32                  PT Short Term Goals - 09/17/16 1019      PT SHORT TERM GOAL #1   Title Pt will demonstrate proper recall of current HEP.   Status Achieved           PT Long Term Goals - 11/15/16 1326      PT LONG TERM GOAL #1   Title Pt will decrease swelling in left knee measurements to mathc the right knee in order to help normalize gait  pattern.   Status Achieved     PT LONG TERM GOAL #2   Title Pt will increase AROM flexion to 120 degrees and AROM extension to neutral in order to help normalize gait and functional movements.   Status Partially Met     PT LONG TERM GOAL #3   Title Pt will be able to ambulate 1000 feet in 6 minutes without AD and no stumbles in order to increase walking activity level.   Status Achieved               Plan - 11/15/16 1409    Clinical Impression Statement Pt has progressed meeting most LTGsand is pleased with her current functional status. Pt has agreed to continue her care on her own.   Rehab Potential Good   PT Frequency 3x / week   PT Treatment/Interventions Cryotherapy;Electrical Stimulation;ADLs/Self Care Home Management;Moist Heat;Gait training;Stair training;Functional mobility training;Patient/family education;Neuromuscular re-education;Balance training;Therapeutic exercise;Therapeutic activities;Ultrasound;Manual techniques;Compression bandaging;Passive range of motion;Vasopneumatic Device;Energy conservation   PT Next Visit Plan D/C      Patient  will benefit from skilled therapeutic intervention in order to improve the following deficits and impairments:  Decreased balance, Decreased activity tolerance, Abnormal gait, Decreased coordination, Decreased endurance, Decreased mobility, Decreased strength, Decreased range of motion, Difficulty walking, Hypomobility, Increased edema, Impaired sensation, Impaired flexibility, Impaired tone, Improper body mechanics, Pain  Visit Diagnosis: Acute pain of left knee  Localized swelling, mass and lump, left lower limb  Difficulty in walking, not elsewhere classified     Problem List Patient Active Problem List   Diagnosis Date Noted  . OA (osteoarthritis) of knee 08/23/2016  . Lobar pneumonia ([facility]) 10/21/2015  . Cough 10/20/2015   PHYSICAL THERAPY DISCHARGE SUMMARY  Visits from Start of Care: 26  Plan: Patient agrees to discharge.  Patient goals were partially met. Patient is being discharged due to being pleased with the current functional level.  ?????        Ronald G Pemberton, PTA 11/15/2016, 2:12 PM  [facility] [address] [place], [place], [address] Phone: [phone]   Fax:  [phone]  Name: Kristin Mckinney MRN: [id number] Date of Birth: 12/28/1955   

## 2016-11-18 MED FILL — traMADol HCL 50 MG TABS: 50 | 10 days supply | Qty: 80 | Fill #0

## 2016-12-21 MED FILL — traMADol HCL 50 MG TABS: 50 | 13 days supply | Qty: 50 | Fill #0

## 2016-12-21 MED FILL — CYCLOBENZAPRINE 10 MG TAB: 10 | 20 days supply | Qty: 60 | Fill #0

## 2017-01-20 MED FILL — traMADol HCL 50 MG TABS: 50 | 12 days supply | Qty: 50 | Fill #0

## 2017-01-20 MED FILL — CYCLOBENZAPRINE 10 MG TAB: 10 | 20 days supply | Qty: 60 | Fill #0

## 2017-02-11 MED FILL — traMADol HCL 50 MG TABS: 50 | 12 days supply | Qty: 50 | Fill #0

## 2017-03-10 MED FILL — traMADol HCL 50 MG TABS: 50 | 13 days supply | Qty: 50 | Fill #0

## 2017-03-10 MED FILL — CYCLOBENZAPRINE 10 MG TAB: 10 | 20 days supply | Qty: 60 | Fill #0

## 2017-03-19 DIAGNOSIS — J101 Influenza due to other identified influenza virus with other respiratory manifestations: Secondary | ICD-10-CM | POA: Diagnosis not present

## 2017-03-23 DIAGNOSIS — J029 Acute pharyngitis, unspecified: Secondary | ICD-10-CM | POA: Diagnosis not present

## 2017-03-23 DIAGNOSIS — H6690 Otitis media, unspecified, unspecified ear: Secondary | ICD-10-CM | POA: Diagnosis not present

## 2017-03-30 DIAGNOSIS — J1089 Influenza due to other identified influenza virus with other manifestations: Secondary | ICD-10-CM | POA: Diagnosis not present

## 2017-04-06 DIAGNOSIS — J1089 Influenza due to other identified influenza virus with other manifestations: Secondary | ICD-10-CM | POA: Diagnosis not present

## 2017-04-07 MED FILL — traMADol HCL 50 MG TABS: 50 | 12 days supply | Qty: 50 | Fill #0

## 2017-05-03 MED FILL — CYCLOBENZAPRINE 10 MG TABLE: 10 | 20 days supply | Qty: 60 | Fill #0

## 2017-05-03 MED FILL — traMADol HCL 50 MG TABS: 50 | 12 days supply | Qty: 50 | Fill #0

## 2017-06-01 MED FILL — traMADol HCL 50 MG TABS: 50 | 12 days supply | Qty: 50 | Fill #0

## 2017-06-20 DIAGNOSIS — J209 Acute bronchitis, unspecified: Secondary | ICD-10-CM | POA: Diagnosis not present

## 2017-06-20 DIAGNOSIS — J00 Acute nasopharyngitis [common cold]: Secondary | ICD-10-CM | POA: Diagnosis not present

## 2017-06-23 DIAGNOSIS — J00 Acute nasopharyngitis [common cold]: Secondary | ICD-10-CM | POA: Diagnosis not present

## 2017-06-23 DIAGNOSIS — J209 Acute bronchitis, unspecified: Secondary | ICD-10-CM | POA: Diagnosis not present

## 2017-06-23 DIAGNOSIS — H6503 Acute serous otitis media, bilateral: Secondary | ICD-10-CM | POA: Diagnosis not present

## 2017-07-05 MED FILL — traMADol HCL 50 MG TABS: 50 | 12 days supply | Qty: 50 | Fill #0

## 2017-08-24 MED FILL — traMADol HCL 50 MG TABS: 50 | 15 days supply | Qty: 60 | Fill #0

## 2017-09-22 MED FILL — traMADol HCL 50 MG TABS: 50 | 30 days supply | Qty: 60 | Fill #0

## 2017-10-14 MED FILL — METHOCARBAMOL 500 MG TABS: 500 | 30 days supply | Qty: 90 | Fill #0

## 2017-10-20 MED FILL — traMADol HCL 50 MG TABS: 50 | 30 days supply | Qty: 60 | Fill #0

## 2017-11-15 DIAGNOSIS — H1031 Unspecified acute conjunctivitis, right eye: Secondary | ICD-10-CM | POA: Diagnosis not present

## 2017-11-29 MED FILL — traMADol HCL 50 MG TABS: 50 | 30 days supply | Qty: 60 | Fill #0

## 2017-12-26 MED FILL — METHOCARBAMOL 500 MG TABS: 500 | 30 days supply | Qty: 90 | Fill #0

## 2017-12-27 MED FILL — traMADol HCL 50 MG TABS: 50 | 30 days supply | Qty: 60 | Fill #0

## 2018-01-05 DIAGNOSIS — J209 Acute bronchitis, unspecified: Secondary | ICD-10-CM | POA: Diagnosis not present

## 2018-01-05 DIAGNOSIS — J189 Pneumonia, unspecified organism: Secondary | ICD-10-CM | POA: Diagnosis not present

## 2018-02-14 MED FILL — METHOCARBAMOL 500 MG TABLET: 500 | 30 days supply | Qty: 90 | Fill #0

## 2018-02-14 MED FILL — traMADol HCL 50 MG TABS: 50 | 30 days supply | Qty: 60 | Fill #0

## 2018-03-16 MED FILL — traMADol HCL 50 MG TABS: 50 | 30 days supply | Qty: 60 | Fill #0

## 2018-04-18 MED FILL — traMADol HCL 50 MG TABS: 50 | 30 days supply | Qty: 60 | Fill #0

## 2018-05-02 MED FILL — METHOCARBAMOL 500 MG TABLET: 500 | 30 days supply | Qty: 90 | Fill #0

## 2018-05-19 MED FILL — traMADol HCL 50 MG TABS: 50 | 30 days supply | Qty: 60 | Fill #0

## 2018-06-19 MED FILL — METHOCARBAMOL 500 MG TABLET: 500 | 30 days supply | Qty: 90 | Fill #0

## 2018-06-19 MED FILL — traMADol HCL 50 MG TABS: 50 | 30 days supply | Qty: 60 | Fill #0

## 2018-07-20 MED FILL — traMADol HCL 50 MG TABS: 50 | 30 days supply | Qty: 60 | Fill #0

## 2018-08-21 MED FILL — METHOCARBAMOL 500 MG TABLET: 500 | 30 days supply | Qty: 90 | Fill #0

## 2018-08-21 MED FILL — traMADol HCL 50 MG TABS: 50 | 30 days supply | Qty: 60 | Fill #0

## 2018-09-21 MED FILL — traMADol HCL 50 MG TABS: 50 | 30 days supply | Qty: 60 | Fill #0

## 2018-10-20 MED FILL — METHOCARBAMOL 500 MG TABLET: 500 | 30 days supply | Qty: 90 | Fill #0

## 2018-10-20 MED FILL — traMADol HCL 50 MG TABS: 50 | 30 days supply | Qty: 60 | Fill #0

## 2018-11-20 MED FILL — traMADol HCL 50 MG TABS: 50 | 30 days supply | Qty: 60 | Fill #0

## 2023-09-06 NOTE — Progress Notes (Signed)
error
